# Patient Record
Sex: Male | Born: 1970 | Race: White | Hispanic: No | Marital: Married | Smoking: Current every day smoker
Health system: Southern US, Community
[De-identification: ages and names within clinical notes are randomized; demographics above are authoritative.]

## PROBLEM LIST (undated history)

## (undated) DIAGNOSIS — K219 Gastro-esophageal reflux disease without esophagitis: Secondary | ICD-10-CM

## (undated) DIAGNOSIS — F319 Bipolar disorder, unspecified: Secondary | ICD-10-CM

## (undated) DIAGNOSIS — I10 Essential (primary) hypertension: Secondary | ICD-10-CM

## (undated) DIAGNOSIS — E119 Type 2 diabetes mellitus without complications: Secondary | ICD-10-CM

## (undated) HISTORY — PX: OTHER SURGICAL HISTORY: SHX169

---

## 2004-10-18 ENCOUNTER — Emergency Department (HOSPITAL_COMMUNITY): Admission: EM | Admit: 2004-10-18 | Discharge: 2004-10-18 | Payer: Self-pay | Admitting: Emergency Medicine

## 2006-01-02 ENCOUNTER — Emergency Department (HOSPITAL_COMMUNITY): Admission: EM | Admit: 2006-01-02 | Discharge: 2006-01-02 | Payer: Self-pay | Admitting: Emergency Medicine

## 2006-02-20 ENCOUNTER — Emergency Department (HOSPITAL_COMMUNITY): Admission: EM | Admit: 2006-02-20 | Discharge: 2006-02-20 | Payer: Self-pay | Admitting: Emergency Medicine

## 2006-03-09 ENCOUNTER — Emergency Department (HOSPITAL_COMMUNITY): Admission: EM | Admit: 2006-03-09 | Discharge: 2006-03-09 | Payer: Self-pay | Admitting: Emergency Medicine

## 2015-01-21 ENCOUNTER — Inpatient Hospital Stay (HOSPITAL_COMMUNITY)
Admission: AD | Admit: 2015-01-21 | Discharge: 2015-01-25 | DRG: 539 | Disposition: A | Discharge status: Skilled Nursing Facility | Payer: Medicaid Other | Source: Other Acute Inpatient Hospital | Attending: Internal Medicine | Admitting: Internal Medicine

## 2015-01-21 DIAGNOSIS — M4628 Osteomyelitis of vertebra, sacral and sacrococcygeal region: Principal | ICD-10-CM | POA: Diagnosis present

## 2015-01-21 DIAGNOSIS — F17200 Nicotine dependence, unspecified, uncomplicated: Secondary | ICD-10-CM | POA: Diagnosis present

## 2015-01-21 DIAGNOSIS — I2510 Atherosclerotic heart disease of native coronary artery without angina pectoris: Secondary | ICD-10-CM | POA: Diagnosis present

## 2015-01-21 DIAGNOSIS — L89153 Pressure ulcer of sacral region, stage 3: Secondary | ICD-10-CM

## 2015-01-21 DIAGNOSIS — D649 Anemia, unspecified: Secondary | ICD-10-CM | POA: Diagnosis not present

## 2015-01-21 DIAGNOSIS — I10 Essential (primary) hypertension: Secondary | ICD-10-CM | POA: Diagnosis present

## 2015-01-21 DIAGNOSIS — E43 Unspecified severe protein-calorie malnutrition: Secondary | ICD-10-CM | POA: Diagnosis present

## 2015-01-21 DIAGNOSIS — J449 Chronic obstructive pulmonary disease, unspecified: Secondary | ICD-10-CM | POA: Diagnosis not present

## 2015-01-21 DIAGNOSIS — M5489 Other dorsalgia: Secondary | ICD-10-CM | POA: Diagnosis present

## 2015-01-21 DIAGNOSIS — L89154 Pressure ulcer of sacral region, stage 4: Secondary | ICD-10-CM | POA: Diagnosis present

## 2015-01-21 DIAGNOSIS — F3130 Bipolar disorder, current episode depressed, mild or moderate severity, unspecified: Secondary | ICD-10-CM | POA: Diagnosis not present

## 2015-01-21 DIAGNOSIS — K219 Gastro-esophageal reflux disease without esophagitis: Secondary | ICD-10-CM | POA: Diagnosis not present

## 2015-01-21 DIAGNOSIS — Z6827 Body mass index (BMI) 27.0-27.9, adult: Secondary | ICD-10-CM

## 2015-01-21 DIAGNOSIS — Z7401 Bed confinement status: Secondary | ICD-10-CM

## 2015-01-21 DIAGNOSIS — R531 Weakness: Secondary | ICD-10-CM | POA: Diagnosis not present

## 2015-01-21 DIAGNOSIS — F322 Major depressive disorder, single episode, severe without psychotic features: Secondary | ICD-10-CM | POA: Diagnosis present

## 2015-01-21 DIAGNOSIS — L8994 Pressure ulcer of unspecified site, stage 4: Secondary | ICD-10-CM | POA: Diagnosis present

## 2015-01-21 DIAGNOSIS — M109 Gout, unspecified: Secondary | ICD-10-CM | POA: Diagnosis not present

## 2015-01-21 DIAGNOSIS — M869 Osteomyelitis, unspecified: Secondary | ICD-10-CM

## 2015-01-21 DIAGNOSIS — E119 Type 2 diabetes mellitus without complications: Secondary | ICD-10-CM | POA: Diagnosis present

## 2015-01-21 DIAGNOSIS — F319 Bipolar disorder, unspecified: Secondary | ICD-10-CM | POA: Diagnosis present

## 2015-01-21 DIAGNOSIS — M8628 Subacute osteomyelitis, other site: Secondary | ICD-10-CM | POA: Diagnosis not present

## 2015-01-21 HISTORY — DX: Type 2 diabetes mellitus without complications: E11.9

## 2015-01-21 HISTORY — DX: Bipolar disorder, unspecified: F31.9

## 2015-01-21 HISTORY — DX: Essential (primary) hypertension: I10

## 2015-01-21 HISTORY — DX: Gastro-esophageal reflux disease without esophagitis: K21.9

## 2015-01-21 MED ORDER — ONDANSETRON HCL 4 MG/2ML IJ SOLN: 4.0000 mg | Freq: Four times a day (QID) | INTRAMUSCULAR | Status: DC | PRN

## 2015-01-21 MED ORDER — ACETAMINOPHEN 650 MG RE SUPP: 650.0000 mg | Freq: Four times a day (QID) | RECTAL | Status: DC | PRN

## 2015-01-21 MED ORDER — ACETAMINOPHEN 325 MG PO TABS
650.0000 mg | ORAL_TABLET | Freq: Four times a day (QID) | ORAL | Status: DC | PRN
  Administered 2015-01-22 – 2015-01-24 (×2): 650 mg via ORAL
  Filled 2015-01-21 (×2): qty 2

## 2015-01-21 MED ORDER — OXYCODONE HCL 5 MG PO TABS
5.0000 mg | ORAL_TABLET | ORAL | Status: DC | PRN
  Administered 2015-01-22 – 2015-01-25 (×9): 5 mg via ORAL
  Filled 2015-01-21 (×9): qty 1

## 2015-01-21 MED ORDER — ENOXAPARIN SODIUM 40 MG/0.4ML ~~LOC~~ SOLN
40.0000 mg | SUBCUTANEOUS | Status: DC
  Administered 2015-01-22 – 2015-01-25 (×4): 40 mg via SUBCUTANEOUS
  Filled 2015-01-21 (×4): qty 0.4

## 2015-01-21 MED ORDER — ONDANSETRON HCL 4 MG PO TABS
4.0000 mg | ORAL_TABLET | Freq: Four times a day (QID) | ORAL | Status: DC | PRN
Start: 2015-01-21 — End: 2015-01-25

## 2015-01-21 NOTE — H&P (Signed)
History and Physical  Patient Name: Antonio Jensen     ZOX:[id number]    DOB: 1970/03/30    DOA: 01/21/2015 Ref[facility] PCP: No primary care Adisa Litt on file.      Chief Complaint: Buttock pain  HPI: Antonio Jensen is a 44 y.o. male with a past medical history significant for severe depression, severe PCM, T2DM, HTN, and GERD who presents with sacral osteomyelitis.  The patient is a poor historian, and so most history is collected from outside ED MD.  He developed a decubitus ulcer on the left buttock over the last few months, he states, that has gotten worse and worse.  Per outside Suesan Mohrmann, the patient was admitted for an abscess of the decubitus ulcer about 6 weeks ago. The ulcer was provided and he was treated with antibiotics for almost a month at [facility] and then discharged 2 weeks ago.  He was at home, but returned to the hospital because his wife noticed drainage from the wound and it was more painful. There is no bone visible in the wound, and [facility] were asked to admit because of availability of infectious disease consultation.  In the ED there, the patient was afebrile with mild leukocytosis and hemodynamically stable.  To me he endorses sitting in his recliner at home and not being able to do much of anything else. He endorses pain. He is not forthcoming with other details and mostly denies questions (fever, chills, pain elsewhere) or says "I don't know".     Review of Systems:  Pt complains of pain in sacrum. Pt denies any fever chills.  Unable to obtain other ROS due to patient degree of psychomotor slowing and disaffect.   Allergies:  Allergies  Allergen Reactions  . Sulfa Antibiotics     Home medications: 1. Risperidone 2 mg nightly 2. Trazodone 5-100 mg daily at bedtime when necessary for sleep 3. Furosemide 20 mg daily 4. Lisinopril-HCTZ 10-12 0.5 mg daily 5. Allopurinol 300 mg daily 6. Omeprazole 40 mg twice  daily 7. Oxycodone 5 mg every 6 hours when necessary for pain   Past medical history: 1. HTN 2. Gout 3. GERD 4. Severe depression or bipolar disorder 5. COPD 6. History of CAD from outside records  Family history:  Hypertension, heart disease, COPD, per outside records.   Social History:  Patient lives with his wife and stepson. He does not work currently. He does smoke sometimes.        Physical Exam: BP 121/70 mmHg  Pulse 88  Temp(Src) 97.7 F (36.5 C) (Oral)  Resp 16  Ht 5\' 2"  (1.575 m)  Wt 68.266 kg (150 lb 8 oz)  BMI 27.52 kg/m2  SpO2 98% General appearance: Emaciated adult male, sleeping and difficult to arouse. Sluggish and marked psychomotor slowing.   Eyes: Left eyelid swelling. Her discharge.    ENT: No nasal deformity, discharge, or epistaxis.  OP moist without lesions.  poor dentition. Halitosis. Skin: Pallor. On the left buttock there is a 3 x 5 cm deep sacral ulcer packed with gauze with surrounding hyperpigmentation without induration. This was not unpacked on my exam. Cardiac: RRR, nl S1-S2, no murmurs appreciated.  3+ LE edema.   Respiratory: Poor inspiratory effort. Abdomen: Abdomen soft , distended, no tenderness to palpation. MSK: Muscle wasting noted on arms, legs, thenar eminence Neuro: Oriented to place, and time and person. Marked psychomotor slowing. Globally very weak. Does not follow commands well.  Psych: Affect catatonic.  No evidence  of aural or visual hallucinations or delusions.       Labs on Admission, transmitted from outside hospital:  The metabolic panel shows normal sodium, potassium, bicarbonate, and renal function. Transaminases and bilirubin are normal. The complete blood count shows mild leukocytosis, anemia to 9.6 g/dL, and normal platelet count    Radiological Exams on Admission: The report of a CT scan of the abdomen and pelvis from the outside hospital notes osteomyelitis of the  sacrum.        Assessment/Plan 1. Sacral osteomyelitis:  The patient has chronic decubitus ulcer which now has resulted in osteomyelitis.  He reportedly took almost a month of antibiotics for this ulcer within the last 6 weeks at [facility] and had a wound-vac on the ulcer at one point in time. -Obtain outside records from last hospitalization from [facility] -Vancomycin and piperacillin tazobactam -Consult infectious disease -Consult the wound care and plastic surgery if necessary      2. Anemia:  Unclear chronicity.  Normocytic.  3. Severe protein calorie malnutrition:  -Nutrition consult  4. GERD:  -Continue PPI  5. Gout:  Stable. No active disease. -Hold allopurinol  6. HTN:  Normotensive at admission. -Hold lisinopril HCTZ and furosemide for now  7. Depression: -Consult to psychiatry -Continue risperidone    DVT PPx: Lovenox Diet: Regular Consultants: ID, Wound Care, Plastic Surgery, Psychiatry, Nutrition Code Status: Full   Patient seen 10:41 PM on 01/21/2015.  Disposition Plan:  Admit for IV antibiotics for osteomyelitis.  Consult to ID, wound, surgery, and Psych.      Alberteen Sam [facility] Pager [phone]

## 2015-01-22 ENCOUNTER — Encounter (HOSPITAL_COMMUNITY): Payer: Self-pay | Admitting: Family Medicine

## 2015-01-22 ENCOUNTER — Inpatient Hospital Stay (HOSPITAL_COMMUNITY): Payer: Medicaid Other

## 2015-01-22 DIAGNOSIS — F3130 Bipolar disorder, current episode depressed, mild or moderate severity, unspecified: Secondary | ICD-10-CM

## 2015-01-22 DIAGNOSIS — I10 Essential (primary) hypertension: Secondary | ICD-10-CM

## 2015-01-22 DIAGNOSIS — L8994 Pressure ulcer of unspecified site, stage 4: Secondary | ICD-10-CM

## 2015-01-22 LAB — CBC
HEMATOCRIT: 27.1 % — AB (ref 39.0–52.0)
HEMOGLOBIN: 8.7 g/dL — AB (ref 13.0–17.0)
MCH: 26.7 pg (ref 26.0–34.0)
MCHC: 32.1 g/dL (ref 30.0–36.0)
MCV: 83.1 fL (ref 78.0–100.0)
Platelets: 128 10*3/uL — ABNORMAL LOW (ref 150–400)
RBC: 3.26 MIL/uL — ABNORMAL LOW (ref 4.22–5.81)
RDW: 14.1 % (ref 11.5–15.5)
WBC: 8.1 10*3/uL (ref 4.0–10.5)

## 2015-01-22 LAB — COMPREHENSIVE METABOLIC PANEL
ALBUMIN: 1.5 g/dL — AB (ref 3.5–5.0)
ALK PHOS: 81 U/L (ref 38–126)
ALT: 9 U/L — AB (ref 17–63)
AST: 10 U/L — AB (ref 15–41)
Anion gap: 4 — ABNORMAL LOW (ref 5–15)
BILIRUBIN TOTAL: 0.2 mg/dL — AB (ref 0.3–1.2)
BUN: 25 mg/dL — AB (ref 6–20)
CALCIUM: 8.1 mg/dL — AB (ref 8.9–10.3)
CO2: 27 mmol/L (ref 22–32)
CREATININE: 0.58 mg/dL — AB (ref 0.61–1.24)
Chloride: 107 mmol/L (ref 101–111)
GFR calc Af Amer: >60 mL/min (ref >60)
GLUCOSE: 157 mg/dL — AB (ref 65–99)
Potassium: 3.7 mmol/L (ref 3.5–5.1)
Sodium: 138 mmol/L (ref 135–145)
TOTAL PROTEIN: 4.6 g/dL — AB (ref 6.5–8.1)

## 2015-01-22 LAB — CREATININE, SERUM
Creatinine, Ser: 0.67 mg/dL (ref 0.61–1.24)
GFR calc Af Amer: >60 mL/min (ref >60)

## 2015-01-22 MED ORDER — ALLOPURINOL 300 MG PO TABS
300.0000 mg | ORAL_TABLET | Freq: Every day | ORAL | Status: DC
  Administered 2015-01-22 – 2015-01-25 (×4): 300 mg via ORAL
  Filled 2015-01-22 (×4): qty 1

## 2015-01-22 MED ORDER — DULOXETINE HCL 30 MG PO CPEP
30.0000 mg | ORAL_CAPSULE | Freq: Every day | ORAL | Status: DC
  Administered 2015-01-22 – 2015-01-25 (×4): 30 mg via ORAL
  Filled 2015-01-22 (×4): qty 1

## 2015-01-22 MED ORDER — RISPERIDONE 0.5 MG PO TABS
2.0000 mg | ORAL_TABLET | Freq: Every day | ORAL | Status: DC
  Administered 2015-01-22 – 2015-01-24 (×3): 2 mg via ORAL
  Filled 2015-01-22 (×3): qty 4

## 2015-01-22 MED ORDER — GADOBENATE DIMEGLUMINE 529 MG/ML IV SOLN
10.0000 mL | Freq: Once | INTRAVENOUS | Status: AC | PRN
  Administered 2015-01-22: 10 mL via INTRAVENOUS

## 2015-01-22 MED ORDER — PIPERACILLIN-TAZOBACTAM 3.375 G IVPB
3.3750 g | Freq: Three times a day (TID) | INTRAVENOUS | Status: DC
  Administered 2015-01-22 (×2): 3.375 g via INTRAVENOUS
  Filled 2015-01-22 (×4): qty 50

## 2015-01-22 MED ORDER — VANCOMYCIN HCL IN DEXTROSE 750-5 MG/150ML-% IV SOLN
750.0000 mg | Freq: Three times a day (TID) | INTRAVENOUS | Status: DC
  Administered 2015-01-22: 750 mg via INTRAVENOUS
  Filled 2015-01-22 (×3): qty 150

## 2015-01-22 MED ORDER — TRAZODONE HCL 50 MG PO TABS: 50.0000 mg | ORAL_TABLET | Freq: Every evening | ORAL | Status: DC | PRN

## 2015-01-22 MED ORDER — LORAZEPAM 2 MG/ML IJ SOLN
1.0000 mg | Freq: Once | INTRAMUSCULAR | Status: AC
  Administered 2015-01-22: 1 mg via INTRAVENOUS
  Filled 2015-01-22: qty 1

## 2015-01-22 MED ORDER — PANTOPRAZOLE SODIUM 40 MG PO TBEC
40.0000 mg | DELAYED_RELEASE_TABLET | Freq: Every day | ORAL | Status: DC
  Administered 2015-01-22 – 2015-01-25 (×4): 40 mg via ORAL
  Filled 2015-01-22 (×4): qty 1

## 2015-01-22 NOTE — Progress Notes (Signed)
Patient Demographics:    Antonio Jensen, is a 44 y.o. male, DOB - January 01, 1971, ZOX:[id number]  Admit dateCorbin Jensen - 01/21/2015   Admitting Physician [name], MD  Outpatient Primary MD for the patient is No primary care provider on file.  LOS - 1   No chief complaint on file.       Subjective:    Antonio Jensen today has, No headache, No chest pain, No abdominal pain - No Nausea, No new weakness tingling or numbness, No Cough - SOB. Continues to have dull low back pain.   Assessment  & Plan :     1. 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 Recently treated with one month of antibiotics [facility], off antibiotics for 2 weeks. Readmitted for possible osteomyelitis. Discussed his case with ID physician Dr. Ilsa IhaSnyder, hold off antibiotics, currently not septic, get MRI of the sacral area. Continue wound care, may require bone biopsy off antibiotics.   2. Denies weakness and deconditioning. Discussed with wife in detail, patient until June of this year was driving and walking, thereafter became progressively weak due to lack of activity which came about after he developed a sacral ulcer due to itching. Initiate PT and placement.   3. Bipolar disorder with depression. Currently on trazodone, risperidone which will be continued.   4. GERD. On PPI continue.   Code Status : Full  Family Communication  : Wife over the phone  Disposition Plan  : To be decided  Consults  :  ID physician Dr. Ilsa IhaSnyder over the phone  Procedures  : MRI sacrum  DVT Prophylaxis  :  Lovenox   Lab Results  Component Value Date   PLT 128* 01/22/2015    Inpatient  Medications  Scheduled Meds: . enoxaparin (LOVENOX) injection  40 mg Subcutaneous Q24H  . pantoprazole  40 mg Oral Daily  . risperiDONE  2 mg Oral QHS   Continuous Infusions:  PRN Meds:.acetaminophen **OR** acetaminophen, ondansetron **OR** ondansetron (ZOFRAN) IV, oxyCODONE, traZODone  Antibiotics  :     Anti-infectives    Start     Dose/Rate Route Frequency Ordered Stop   01/22/15 0100  vancomycin (VANCOCIN) IVPB 750 mg/150 ml premix  Status:  Discontinued     750 mg 150 mL/hr over 60 Minutes Intravenous Every 8 hours 01/22/15 0008 01/22/15 0931   01/22/15 0015  piperacillin-tazobactam (ZOSYN) IVPB 3.375 g  Status:  Discontinued     3.375 g 12.5 mL/hr over 240 Minutes Intravenous 3 times per day 01/22/15 0008 01/22/15 0931        Objective:   Filed Vitals:   01/21/15 2240 01/22/15 0516  BP: 109/58 121/70  Pulse: 88 88  Temp: 98.7 F (37.1 C) 97.7 F (36.5 C)  TempSrc: Oral Oral  Resp: 16 16  Height: 5\' 2"  (1.575 m)   Weight: 68.266 kg (150 lb 8 oz)   SpO2: 98% 98%    Wt Readings from Last 3 Encounters:  01/21/15 68.266 kg (150 lb 8 oz)     Intake/Output Summary (Last 24 hours) at 01/22/15 1242 Last data filed at 01/22/15 0500  Gross per 24 hour  Intake      0 ml  Output    400 ml  Net   -400 ml     Physical Exam  Awake Alert, Oriented X 3, No new F.N deficits, Normal affect [place].AT,PERRAL Supple Neck,No JVD, No cervical lymphadenopathy appriciated.  Symmetrical Chest wall movement, Good air movement bilaterally, CTAB RRR,No Gallops,Rubs or new Murmurs, No Parasternal Heave +ve B.Sounds, Abd Soft, No tenderness, No organomegaly appriciated, No rebound - guarding or rigidity. No Cyanosis, Clubbing or edema, No new Rash or bruise  Stage IV sacral decubitus ulcer kindly see wound care note for all details    Data Review:   Micro Results No results found for this or any previous visit (from the past 240 hour(s)).  Radiology Reports No results found.    CBC  Recent Labs Lab 01/22/15 0002  WBC 8.1  HGB 8.7*  HCT 27.1*  PLT 128*  MCV 83.1  MCH 26.7  MCHC 32.1  RDW 14.1    Chemistries   Recent Labs Lab 01/22/15 0002 01/22/15 0350  NA  --  138  K  --  3.7  CL  --  107  CO2  --  27  GLUCOSE  --  157*  BUN  --  25*  CREATININE 0.67 0.58*  CALCIUM  --  8.1*  AST  --  10*  ALT  --  9*  ALKPHOS  --  81  BILITOT  --  0.2*   ------------------------------------------------------------------------------------------------------------------ estimated creatinine clearance is 98.1 mL/min (by C-G formula based on Cr of 0.58). ------------------------------------------------------------------------------------------------------------------ No results for input(s): HGBA1C in the last 72 hours. ------------------------------------------------------------------------------------------------------------------ No results for input(s): CHOL, HDL, LDLCALC, TRIG, CHOLHDL, LDLDIRECT in the last 72 hours. ------------------------------------------------------------------------------------------------------------------ No results for input(s): TSH, T4TOTAL, T3FREE, THYROIDAB in the last 72 hours.  Invalid input(s): FREET3 ------------------------------------------------------------------------------------------------------------------ No results for input(s): VITAMINB12, FOLATE, FERRITIN, TIBC, IRON, RETICCTPCT in the last 72 hours.  Coagulation profile No results for input(s): INR, PROTIME in the last 168 hours.  No results for input(s): DDIMER in the last 72 hours.  Cardiac Enzymes No results for input(s): CKMB, TROPONINI, MYOGLOBIN in the last 168 hours.  Invalid input(s): CK ------------------------------------------------------------------------------------------------------------------ Invalid input(s): POCBNP   Time Spent in minutes  35   SINGH,PRASHANT K M.D on 01/22/2015 at 12:42 PM  Between 7am to 7pm - Pager -  [phone]  After 7pm go to [email or web address] - password [facility]  [facility] -  Office  [phone]

## 2015-01-22 NOTE — Progress Notes (Signed)
ANTIBIOTIC CONSULT NOTE - INITIAL  Pharmacy Consult for Vancomycin/Zosyn Indication: Wound infection, sacral osteomyelitis  Allergies  Allergen Reactions  . Sulfa Antibiotics    Patient Measurements: Height: 5\' 2"  (157.5 cm) Weight: 150 lb 8 oz (68.266 kg) IBW/kg (Calculated) : 54.6  Vital Signs: Temp: 98.7 F (37.1 C) (11/26 2240) Temp Source: Oral (11/26 2240) BP: 109/58 mmHg (11/26 2240) Pulse Rate: 88 (11/26 2240)   [facility]abs [facility] per MD note: Scr 0.7 WBC 11.1  Assessment: 44 y/o M [facility] with sacral osteomyelitis/wound infection, Scr 0.7 but renal function may be harder to estimate given that pt is mostly bed bound and has a low muscle mass, received a dose of Vancomycin at [facility] around 1600 on 11/26.   Goal of Therapy:  Vancomycin trough level 15-20 mcg/ml  Plan:  -Vancomycin 750 mg IV q8h -Zosyn 3.375G IV q8h to be infused over 4 hours -Trend WBC, temp, renal function  -Drug levels as indicated   Abran DukeLedford, Katelyne Galster 01/22/2015,12:02 AM

## 2015-01-22 NOTE — Progress Notes (Signed)
Utilization review completed.  

## 2015-01-22 NOTE — Consult Note (Signed)
WOC wound consult note Reason for Consult: Chronic, non-healing Stage 4 pressure injury to scarum, DTPI to right lateral heel and blanchable erythema to left lateral heel.  Patient found in bed lying in the supine position. It is noted that Plastics has been consulted.  I will provide Nursing with orders until that assessment and POC is established. Wound type:pressure Pressure Ulcer POA: Yes Measurement: 5.2cm x 8.4cm x 2cm with undermining at 3 o'clock measuring 2cm and at 11 o'clock measuring 3cm Wound bed: red, moist with <25% necrotic tissue. The left lateral heel presents with blanchable erythema in an area measuring 2cm x 3cm and the right lateral heel presents with an area of deep tissue pressure injury, i.e. Purple discoloration measuring 1 cm x 1.6cm. Drainage (amount, consistency, odor) small amount of serous exudate on old dressing, but bedside RN reports that it was changed less than 1 hour ago.  Patient is incontinence of soft brown stool and the previously changed dressing is soiled and contaminated. Periwound: Circumferential erythema and evidence of medical adhesive related skin injury (MARSI). Dressing procedure/placement/frequency: Orders are provided today for light filling of the sacral defect with saline moistened roll gauze (Kerlix), protection of the periwound skin with our house moisture barrier ointment (Critic Aid Clear) and covering both areas with a soft absorbant dressing (ABD).  The adhesive recommended is paper tape and the change frequency is twice daily and PRN soiling.  A therapeutic mattress (constant low pressure) with low air loss feature is provided as are bilateral pressure redistribution heel boots as patient does not like to turn and reposition off of the supine position. Nevertheless, Nursing is encouraged to turn and reposition despite the therapeutic mattress. WOC nursing team will not follow, as Plastics has been consulted and their orders will supercede those  of WOC Nursing.   Please re-consult if needed. Thanks, Ladona MowLaurie Kinza Gouveia, MSN, RN, GNP, Hans EdenCWOCN, CWON-AP, FAAN  Pager# [phone]

## 2015-01-22 NOTE — Consult Note (Signed)
[facility] Psychiat[facility] Consult   Reason for Consult:  Bipolar disorder, depression Referring Physician:  Dr. Candiss Norse Patient Identification: PAU BANH MRN:  [id number] Principal Diagnosis: Osteomyelitis [facility]) Diagnosis:   Patient Active Problem List   Diagnosis Date Noted  . Pressure ulcer stage IV ([place]) [L89.94] 01/21/2015  . Osteomyelitis ([place]) [M86.9] 01/21/2015  . Essential hypertension [I10] 01/21/2015  . Bipolar depression ([place]) [F31.30] 01/21/2015    Total Time spent with patient: 20 minutes  Subjective:   Antonio Jensen is a 44 y.o. male patient admitted with sacral osteomyelitis.  HPI:  This patient is a 44 year old married white male who lives with his wife and son in [place]. He is unemployed and applying for disability. He was admitted from an outside hospital with clinical evidence of sacral osteomyelitis and stage IV sacral decubitus ulcer. He is in a wheelchair bedbound status due to generalized weakness and deconditioning. He was recently treated with the month of antibiotics at [facility]. Currently he is off antibiotics and is not septic and an MRI of the sacral area has been obtained.  Apparently the patient has a history of bipolar disorder but he said very little and only answered questions with yes or no and went right back to sleep. I attempted to call his wife at both numbers with no answer. According to the notes he was driving and walking until June of this year and became progressively weak due to lack of activity after he developed a sacral ulcer due to itching. He is a poor historian barely opens his eyes and will not answer most questions other than a yes or no. He is states that he is simply tired of being in and out of hospitals and  His wound not healing. He denies past psychiatric inpatient hospitalizations or psychiatric outpatient treatment. He claims his primary care physician handles all of his medicine. He does not know why he is on  Risperdal. He does not remember being on other medications for depression. He denies being suicidal or having auditory visualizations. He denies use of drugs or alcohol  Past Psychiatric History: Chart reveals history of bipolar disorder but no other information is forthcoming from the patient  Risk to Self: Is patient at risk for suicide?: No Risk to Others:   Prior Inpatient Therapy:   Prior Outpatient Therapy:    Past Medical History:  Past Medical History  Diagnosis Date  . Hypertension   . Bipolar disorder ([place])   . GERD (gastroesophageal reflux disease)   . Diabetes mellitus without complication [facility])     Past Surgical History  Procedure Laterality Date  . Boil i&d  unknown    hx provided by wife   . Testical i&d/boil  unknown    hx provided by wife   . Cardiac cathertization no stent placed  unknown     hx provided by wife    Family History:  Family History  Problem Relation Age of Onset  . Hypertension Other   . Coronary artery disease Other   . COPD Other    Family Psychiatric  History:none Social History:  History  Alcohol Use No     History  Drug Use No    Social History   Social History  . Marital Status: Married    Spouse Name: N/A  . Number of Children: N/A  . Years of Education: N/A   Social History Main Topics  . Smoking status: Current Every Day Smoker  . Smokeless tobacco: None  .  Alcohol Use: No  . Drug Use: No  . Sexual Activity: Not Asked   Other Topics Concern  . None   Social History Narrative  . None   Additional Social History:                          Allergies:   Allergies  Allergen Reactions  . Sulfa Antibiotics     Labs:  Results for orders placed or performed during the hospital encounter of 01/21/15 (from the past 48 hour(s))  CBC     Status: Abnormal   Collection Time: 01/22/15 12:02 AM  Result Value Ref Range   WBC 8.1 4.0 - 10.5 K/uL    Comment: WHITE COUNT CONFIRMED ON SMEAR   RBC 3.26 (L) 4.22  - 5.81 MIL/uL   Hemoglobin 8.7 (L) 13.0 - 17.0 g/dL   HCT 27.1 (L) 39.0 - 52.0 %   MCV 83.1 78.0 - 100.0 fL   MCH 26.7 26.0 - 34.0 pg   MCHC 32.1 30.0 - 36.0 g/dL   RDW 14.1 11.5 - 15.5 %   Platelets 128 (L) 150 - 400 K/uL    Comment: PLATELET COUNT CONFIRMED BY SMEAR  Creatinine, serum     Status: None   Collection Time: 01/22/15 12:02 AM  Result Value Ref Range   Creatinine, Ser 0.67 0.61 - 1.24 mg/dL   GFR calc non Af Amer >60 >60 mL/min   GFR calc Af Amer >60 >60 mL/min    Comment: (NOTE) The eGFR has been calculated using the CKD EPI equation. This calculation has not been validated in all clinical situations. eGFR's persistently <60 mL/min signify possible Chronic Kidney Disease.   Comprehensive metabolic panel     Status: Abnormal   Collection Time: 01/22/15  3:50 AM  Result Value Ref Range   Sodium 138 135 - 145 mmol/L   Potassium 3.7 3.5 - 5.1 mmol/L   Chloride 107 101 - 111 mmol/L   CO2 27 22 - 32 mmol/L   Glucose, Bld 157 (H) 65 - 99 mg/dL   BUN 25 (H) 6 - 20 mg/dL   Creatinine, Ser 0.58 (L) 0.61 - 1.24 mg/dL   Calcium 8.1 (L) 8.9 - 10.3 mg/dL   Total Protein 4.6 (L) 6.5 - 8.1 g/dL   Albumin 1.5 (L) 3.5 - 5.0 g/dL   AST 10 (L) 15 - 41 U/L   ALT 9 (L) 17 - 63 U/L   Alkaline Phosphatase 81 38 - 126 U/L   Total Bilirubin 0.2 (L) 0.3 - 1.2 mg/dL   GFR calc non Af Amer >60 >60 mL/min   GFR calc Af Amer >60 >60 mL/min    Comment: (NOTE) The eGFR has been calculated using the CKD EPI equation. This calculation has not been validated in all clinical situations. eGFR's persistently <60 mL/min signify possible Chronic Kidney Disease.    Anion gap 4 (L) 5 - 15    Current Facility-Administered Medications  Medication Dose Route Frequency Provider Last Rate Last Dose  . acetaminophen (TYLENOL) tablet 650 mg  650 mg Oral Q6H PRN Edwin Dada, MD       Or  . acetaminophen (TYLENOL) suppository 650 mg  650 mg Rectal Q6H PRN Edwin Dada, MD      .  allopurinol (ZYLOPRIM) tablet 300 mg  300 mg Oral Daily Thurnell Lose, MD      . enoxaparin (LOVENOX) injection 40 mg  40 mg Subcutaneous Q24H Harrell Gave  Shirleen Schirmer, MD   40 mg at 01/22/15 1042  . ondansetron (ZOFRAN) tablet 4 mg  4 mg Oral Q6H PRN Edwin Dada, MD       Or  . ondansetron (ZOFRAN) injection 4 mg  4 mg Intravenous Q6H PRN Edwin Dada, MD      . oxyCODONE (Oxy IR/ROXICODONE) immediate release tablet 5 mg  5 mg Oral Q4H PRN Edwin Dada, MD   5 mg at 01/22/15 0807  . pantoprazole (PROTONIX) EC tablet 40 mg  40 mg Oral Daily Edwin Dada, MD   40 mg at 01/22/15 1042  . risperiDONE (RISPERDAL) tablet 2 mg  2 mg Oral QHS Edwin Dada, MD      . traZODone (DESYREL) tablet 50-100 mg  50-100 mg Oral QHS PRN Edwin Dada, MD        Musculoskeletal: Strength & Muscle Tone: decreased Gait & Station: unable to stand Patient leans: N/A  Psychiatric Specialty Exam: Review of Systems  Constitutional: Positive for malaise/fatigue.  Musculoskeletal: Positive for back pain.  Psychiatric/Behavioral: Positive for depression.    Blood pressure 121/70, pulse 88, temperature 97.7 F (36.5 C), temperature source Oral, resp. rate 16, height 5' 2"  (1.575 m), weight 68.266 kg (150 lb 8 oz), SpO2 98 %.Body mass index is 27.52 kg/(m^2).  General Appearance: Disheveled  Eye Sport and exercise psychologist::  None  Speech:  Slow  Volume:  Decreased  Mood:  Depressed and Dysphoric unable to open eyes or stay awake   Affect:  Constricted and Depressed  Thought Process:  Difficult to assess as he says so little  Orientation:  Full (Time, Place, and Person)  Thought Content:  Rumination  Suicidal Thoughts:  No  Homicidal Thoughts:  No  Memory:  Immediate;   Poor Recent;   Poor Remote;   Poor  Judgement:  Impaired  Insight:  Lacking  Psychomotor Activity:  Decreased  Concentration:  Poor  Recall:  Poor  Fund of Knowledge:Poor  Language: Fair  Akathisia:  No   Handed:  Right  AIMS (if indicated):     Assets:  Social Support  ADL's:  Impaired  Cognition: WNL  Sleep:      Treatment Plan Summary: Daily contact with patient to assess and evaluate symptoms and progress in treatment and Medication management  Disposition: Patient does not meet criteria for psychiatric inpatient admission.  Patient was very difficult to interview as he would not wake up and answer questions. He states that he is tired and I so he severely depressed. He is currently on no medication directly to treat depression. Since he is in pain we will start with Cymbalta 30 mg daily and continue to follow.  ROSS, [facility] 01/22/2015 3:11 PM

## 2015-01-23 DIAGNOSIS — M869 Osteomyelitis, unspecified: Secondary | ICD-10-CM

## 2015-01-23 LAB — CBC
HEMATOCRIT: 25.6 % — AB (ref 39.0–52.0)
HEMOGLOBIN: 8.2 g/dL — AB (ref 13.0–17.0)
MCH: 27 pg (ref 26.0–34.0)
MCHC: 32 g/dL (ref 30.0–36.0)
MCV: 84.2 fL (ref 78.0–100.0)
Platelets: 265 10*3/uL (ref 150–400)
RBC: 3.04 MIL/uL — AB (ref 4.22–5.81)
RDW: 14.1 % (ref 11.5–15.5)
WBC: 8.6 10*3/uL (ref 4.0–10.5)

## 2015-01-23 LAB — COMPREHENSIVE METABOLIC PANEL
ALBUMIN: 1.5 g/dL — AB (ref 3.5–5.0)
ALT: 9 U/L — AB (ref 17–63)
ANION GAP: 7 (ref 5–15)
AST: 11 U/L — AB (ref 15–41)
Alkaline Phosphatase: 79 U/L (ref 38–126)
BILIRUBIN TOTAL: 0.5 mg/dL (ref 0.3–1.2)
BUN: 19 mg/dL (ref 6–20)
CALCIUM: 7.9 mg/dL — AB (ref 8.9–10.3)
CO2: 26 mmol/L (ref 22–32)
CREATININE: 0.74 mg/dL (ref 0.61–1.24)
Chloride: 105 mmol/L (ref 101–111)
GFR calc Af Amer: >60 mL/min (ref >60)
GFR calc non Af Amer: >60 mL/min (ref >60)
GLUCOSE: 282 mg/dL — AB (ref 65–99)
Potassium: 3.6 mmol/L (ref 3.5–5.1)
SODIUM: 138 mmol/L (ref 135–145)
TOTAL PROTEIN: 4.5 g/dL — AB (ref 6.5–8.1)

## 2015-01-23 LAB — HEMOGLOBIN A1C
HEMOGLOBIN A1C: 6.4 % — AB (ref 4.8–5.6)
MEAN PLASMA GLUCOSE: 137 mg/dL

## 2015-01-23 MED ORDER — ADULT MULTIVITAMIN W/MINERALS CH
1.0000 | ORAL_TABLET | Freq: Every day | ORAL | Status: DC
Start: 2015-01-23 — End: 2015-01-25
  Administered 2015-01-23 – 2015-01-25 (×3): 1 via ORAL
  Filled 2015-01-23 (×3): qty 1

## 2015-01-23 MED ORDER — DEXTROSE 5 % IV SOLN
2.0000 g | INTRAVENOUS | Status: DC
  Administered 2015-01-23 – 2015-01-25 (×3): 2 g via INTRAVENOUS
  Filled 2015-01-23 (×3): qty 2

## 2015-01-23 MED ORDER — VANCOMYCIN HCL IN DEXTROSE 750-5 MG/150ML-% IV SOLN
750.0000 mg | Freq: Three times a day (TID) | INTRAVENOUS | Status: DC
  Administered 2015-01-23 – 2015-01-24 (×5): 750 mg via INTRAVENOUS
  Filled 2015-01-23 (×7): qty 150

## 2015-01-23 MED ORDER — SODIUM CHLORIDE 0.9 % IJ SOLN
10.0000 mL | INTRAMUSCULAR | Status: DC | PRN
  Administered 2015-01-25: 10 mL
  Filled 2015-01-23: qty 40

## 2015-01-23 MED ORDER — PRO-STAT SUGAR FREE PO LIQD
30.0000 mL | Freq: Two times a day (BID) | ORAL | Status: DC
  Administered 2015-01-23: 1 mL via ORAL
  Administered 2015-01-24: 30 mL via ORAL
  Filled 2015-01-23 (×2): qty 30

## 2015-01-23 NOTE — Consult Note (Addendum)
[place] [facility]iatry Consult   Reason for Consult:  Bipolar disorder, depression Referring Physician:  Dr. Candiss Norse Patient Identification: Antonio Jensen MRN:  [id number] Principal Diagnosis: Bipolar depression [facility]) Diagnosis:   Patient Active Problem List   Diagnosis Date Noted  . Pressure ulcer stage IV ([place]) [L89.94] 01/21/2015  . Osteomyelitis ([place]) [M86.9] 01/21/2015  . Essential hypertension [I10] 01/21/2015  . Bipolar depression ([place]) [F31.30] 01/21/2015    Total Time spent with patient: 20 minutes  Subjective:   Antonio Jensen is a 44 y.o. male patient admitted with sacral osteomyelitis.  HPI:  This patient is a 44 year old married white male who lives with his wife and son in [place]. He is unemployed and applying for disability. He was admitted from an outside hospital with clinical evidence of sacral osteomyelitis and stage IV sacral decubitus ulcer. He is in a wheelchair/bed bound status due to generalized weakness and deconditioning. He was recently treated with the month of antibiotics at [facility]. Currently he is off antibiotics and is not septic and an MRI of the sacral area has been obtained.  Apparently the patient has a history of bipolar disorder but he said very little and only answered questions with yes or no and went right back to sleep. I attempted to call his wife at both numbers with no answer. According to the notes he was driving and walking until June of this year and became progressively weak due to lack of activity after he developed a sacral ulcer due to itching. He is a poor historian barely opens his eyes and will not answer most questions other than a yes or no. He is states that he is simply tired of being in and out of hospitals and  His wound not healing. He denies past psychiatric inpatient hospitalizations or psychiatric outpatient treatment. He claims his primary care physician handles all of his medicine. He does not know why he  is on Risperdal. He does not remember being on other medications for depression. He denies being suicidal or having auditory visualizations. He denies use of drugs or alcohol  Past Psychiatric History: Chart reveals history of bipolar disorder but no other information is forthcoming from the patient  Interval history: Patient seen today for psychiatric consultation follow-up as requested by Dr. Harrington Challenger. Reviewed initial psychiatric evaluation completed by Dr. Harrington Challenger who recommended Cymbalta 30 mg for depression. Patient is awake, alert, oriented and staying in his bed with head end was elevated during my evaluation. Patient does not have new complaints today and reportedly compliant with his medication and had no side effects. Patient reported he has good pain control with the current treatment regimen. Patient denies any suicidal/homicidal ideation and has no evidence of psychosis.  Risk to Self: Is patient at risk for suicide?: No Risk to Others:   Prior Inpatient Therapy:   Prior Outpatient Therapy:    Past Medical History:  Past Medical History  Diagnosis Date  . Hypertension   . Bipolar disorder ([place])   . GERD (gastroesophageal reflux disease)   . Diabetes mellitus without complication [facility])     Past Surgical History  Procedure Laterality Date  . Boil i&d  unknown    hx provided by wife   . Testical i&d/boil  unknown    hx provided by wife   . Cardiac cathertization no stent placed  unknown     hx provided by wife    Family History:  Family History  Problem Relation Age of Onset  .  Hypertension Other   . Coronary artery disease Other   . COPD Other    Family Psychiatric  History:none Social History:  History  Alcohol Use No     History  Drug Use No    Social History   Social History  . Marital Status: Married    Spouse Name: N/A  . Number of Children: N/A  . Years of Education: N/A   Social History Main Topics  . Smoking status: Current Every Day Smoker  .  Smokeless tobacco: None  . Alcohol Use: No  . Drug Use: No  . Sexual Activity: Not Asked   Other Topics Concern  . None   Social History Narrative  . None   Additional Social History:                          Allergies:   Allergies  Allergen Reactions  . Sulfa Antibiotics     Labs:  Results for orders placed or performed during the hospital encounter of 01/21/15 (from the past 48 hour(s))  CBC     Status: Abnormal   Collection Time: 01/22/15 12:02 AM  Result Value Ref Range   WBC 8.1 4.0 - 10.5 K/uL    Comment: WHITE COUNT CONFIRMED ON SMEAR   RBC 3.26 (L) 4.22 - 5.81 MIL/uL   Hemoglobin 8.7 (L) 13.0 - 17.0 g/dL   HCT 27.1 (L) 39.0 - 52.0 %   MCV 83.1 78.0 - 100.0 fL   MCH 26.7 26.0 - 34.0 pg   MCHC 32.1 30.0 - 36.0 g/dL   RDW 14.1 11.5 - 15.5 %   Platelets 128 (L) 150 - 400 K/uL    Comment: PLATELET COUNT CONFIRMED BY SMEAR  Creatinine, serum     Status: None   Collection Time: 01/22/15 12:02 AM  Result Value Ref Range   Creatinine, Ser 0.67 0.61 - 1.24 mg/dL   GFR calc non Af Amer >60 >60 mL/min   GFR calc Af Amer >60 >60 mL/min    Comment: (NOTE) The eGFR has been calculated using the CKD EPI equation. This calculation has not been validated in all clinical situations. eGFR's persistently <60 mL/min signify possible Chronic Kidney Disease.   Comprehensive metabolic panel     Status: Abnormal   Collection Time: 01/22/15  3:50 AM  Result Value Ref Range   Sodium 138 135 - 145 mmol/L   Potassium 3.7 3.5 - 5.1 mmol/L   Chloride 107 101 - 111 mmol/L   CO2 27 22 - 32 mmol/L   Glucose, Bld 157 (H) 65 - 99 mg/dL   BUN 25 (H) 6 - 20 mg/dL   Creatinine, Ser 0.58 (L) 0.61 - 1.24 mg/dL   Calcium 8.1 (L) 8.9 - 10.3 mg/dL   Total Protein 4.6 (L) 6.5 - 8.1 g/dL   Albumin 1.5 (L) 3.5 - 5.0 g/dL   AST 10 (L) 15 - 41 U/L   ALT 9 (L) 17 - 63 U/L   Alkaline Phosphatase 81 38 - 126 U/L   Total Bilirubin 0.2 (L) 0.3 - 1.2 mg/dL   GFR calc non Af Amer >60 >60  mL/min   GFR calc Af Amer >60 >60 mL/min    Comment: (NOTE) The eGFR has been calculated using the CKD EPI equation. This calculation has not been validated in all clinical situations. eGFR's persistently <60 mL/min signify possible Chronic Kidney Disease.    Anion gap 4 (L) 5 - 15  Hemoglobin A1c  Status: Abnormal   Collection Time: 01/22/15  3:50 AM  Result Value Ref Range   Hgb A1c MFr Bld 6.4 (H) 4.8 - 5.6 %    Comment: (NOTE)         Pre-diabetes: 5.7 - 6.4         Diabetes: >6.4         Glycemic control for adults with diabetes: <7.0    Mean Plasma Glucose 137 mg/dL    Comment: (NOTE) Performed At: [facility] [place], [place] [id number] Lindon Romp MD QQ:[id number]   CBC     Status: Abnormal   Collection Time: 01/23/15  3:46 AM  Result Value Ref Range   WBC 8.6 4.0 - 10.5 K/uL   RBC 3.04 (L) 4.22 - 5.81 MIL/uL   Hemoglobin 8.2 (L) 13.0 - 17.0 g/dL   HCT 25.6 (L) 39.0 - 52.0 %   MCV 84.2 78.0 - 100.0 fL   MCH 27.0 26.0 - 34.0 pg   MCHC 32.0 30.0 - 36.0 g/dL   RDW 14.1 11.5 - 15.5 %   Platelets 265 150 - 400 K/uL  Comprehensive metabolic panel     Status: Abnormal   Collection Time: 01/23/15  3:46 AM  Result Value Ref Range   Sodium 138 135 - 145 mmol/L   Potassium 3.6 3.5 - 5.1 mmol/L   Chloride 105 101 - 111 mmol/L   CO2 26 22 - 32 mmol/L   Glucose, Bld 282 (H) 65 - 99 mg/dL   BUN 19 6 - 20 mg/dL   Creatinine, Ser 0.74 0.61 - 1.24 mg/dL   Calcium 7.9 (L) 8.9 - 10.3 mg/dL   Total Protein 4.5 (L) 6.5 - 8.1 g/dL   Albumin 1.5 (L) 3.5 - 5.0 g/dL   AST 11 (L) 15 - 41 U/L   ALT 9 (L) 17 - 63 U/L   Alkaline Phosphatase 79 38 - 126 U/L   Total Bilirubin 0.5 0.3 - 1.2 mg/dL   GFR calc non Af Amer >60 >60 mL/min   GFR calc Af Amer >60 >60 mL/min    Comment: (NOTE) The eGFR has been calculated using the CKD EPI equation. This calculation has not been validated in all clinical situations. eGFR's persistently <60 mL/min signify  possible Chronic Kidney Disease.    Anion gap 7 5 - 15    Current Facility-Administered Medications  Medication Dose Route Frequency Provider Last Rate Last Dose  . acetaminophen (TYLENOL) tablet 650 mg  650 mg Oral Q6H PRN Edwin Dada, MD   650 mg at 01/22/15 2242   Or  . acetaminophen (TYLENOL) suppository 650 mg  650 mg Rectal Q6H PRN Edwin Dada, MD      . allopurinol (ZYLOPRIM) tablet 300 mg  300 mg Oral Daily Thurnell Lose, MD   300 mg at 01/22/15 1745  . DULoxetine (CYMBALTA) DR capsule 30 mg  30 mg Oral Daily Cloria Spring, MD   30 mg at 01/22/15 1745  . enoxaparin (LOVENOX) injection 40 mg  40 mg Subcutaneous Q24H Edwin Dada, MD   40 mg at 01/22/15 1042  . ondansetron (ZOFRAN) tablet 4 mg  4 mg Oral Q6H PRN Edwin Dada, MD       Or  . ondansetron (ZOFRAN) injection 4 mg  4 mg Intravenous Q6H PRN Edwin Dada, MD      . oxyCODONE (Oxy IR/ROXICODONE) immediate release tablet 5 mg  5 mg Oral Q4H PRN Harrell Gave  Shirleen Schirmer, MD   5 mg at 01/23/15 0553  . pantoprazole (PROTONIX) EC tablet 40 mg  40 mg Oral Daily Edwin Dada, MD   40 mg at 01/22/15 1042  . risperiDONE (RISPERDAL) tablet 2 mg  2 mg Oral QHS Edwin Dada, MD   2 mg at 01/22/15 2150  . traZODone (DESYREL) tablet 50-100 mg  50-100 mg Oral QHS PRN Edwin Dada, MD        Musculoskeletal: Strength & Muscle Tone: decreased Gait & Station: unable to stand Patient leans: N/A  Psychiatric Specialty Exam: Review of Systems :   Blood pressure 122/72, pulse 87, temperature 97.5 F (36.4 C), temperature source Oral, resp. rate 16, height 5' 2"  (1.575 m), weight 68.266 kg (150 lb 8 oz), SpO2 97 %.Body mass index is 27.52 kg/(m^2).  General Appearance: Disheveled, decreased psychomotor activity   Eye Contact::  Fair  Speech:  Slow  Volume:  Decreased  Mood:  Depressed    Affect:  Congruent and Constricted  Thought Process:  Coherent and Goal  Directed  Orientation:  Full (Time, Place, and Person)  Thought Content:  WDL  Suicidal Thoughts:  No  Homicidal Thoughts:  No  Memory:  Immediate;   Fair Recent;   Fair Remote;   Fair  Judgement:  Impaired  Insight:  Lacking  Psychomotor Activity:  Decreased  Concentration:  Fair  Recall:  Poor  Fund of Knowledge:Poor  Language: Fair  Akathisia:  No  Handed:  Right  AIMS (if indicated):     Assets:  Social Support  ADL's:  Impaired  Cognition: WNL  Sleep:      Treatment Plan Summary: Daily contact with patient to assess and evaluate symptoms and progress in treatment and Medication management  Disposition: Patient does not meet criteria for psychiatric inpatient admission.  We'll continue Cymbalta 30 mg daily and may increase to 40 mg or 60 mg as clinically required for depression Continue risperidone 2 mg at bedtime for mood swings and trazodone 50 mg for insomnia  Monitor for manic switch and May use mood stabilizer if needed due to history of bipolar disorder Appreciate psychiatric consultation and follow up as clinically required Please contact [phone] or [phone] if needs further assistance Refer to the unit social worker regarding out-of-home placement especially skilled nursing facility as he was not able to care for himself.  Aivah Putman,JANARDHAHA R. 01/23/2015 10:04 AM

## 2015-01-23 NOTE — Progress Notes (Signed)
Initial Nutrition Assessment  INTERVENTION:  Recommend providing 500 mg of Vitamin C BID for 14 days  Provide 30 ml Pro-stat BID, each dose provides 100 kcal and 15 grams of protein Provide Multivitamin with minerals daily Monitor PO intake for adequacy   NUTRITION DIAGNOSIS:   Increased nutrient needs related to wound healing as evidenced by estimated needs.   GOAL:   Patient will meet greater than or equal to 90% of their needs   MONITOR:   PO intake, Supplement acceptance, Labs, Weight trends, Skin, I & O's  REASON FOR ASSESSMENT:   Consult Assessment of nutrition requirement/status  ASSESSMENT:   44 y.o. male with a past medical history significant for severe depression, severe PCM, T2DM, HTN, and GERD who presents with sacral osteomyelitis.  Pt states that his appetite has been good and he has been eating well with 3 daily meals. He reports eating fruits and vegetables at most meals and protein-rich foods at some meals. He denies any weight loss, stating that he usually weighs 138 lbs and that his weight is up due to receiving fluids on admission. Per nursing notes, pt is eating 100% of meals.   Labs: low hemoglobin, low calcium  Diet Order:  Diet regular Room service appropriate?: Yes; Fluid consistency:: Thin  Skin:  Wound (see comment) (Stage IV pressure ulcer on sacrum; MASD on groin)  Last BM:  11/28  Height:   Ht Readings from Last 1 Encounters:  01/21/15 5\' 2"  (1.575 m)    Weight:   Wt Readings from Last 1 Encounters:  01/21/15 150 lb 8 oz (68.266 kg)    Ideal Body Weight:  53.6 kg  BMI:  Body mass index is 27.52 kg/(m^2).  Estimated Nutritional Needs:   Kcal:  2100-2300  Protein:  90-100 grams  Fluid:  2.1 L/day  EDUCATION NEEDS:   No education needs identified at this time  Dorothea Ogleeanne Aime Meloche RD, LDN Inpatient Clinical Dietitian Pager: [phone] After Hours Pager: [phone]

## 2015-01-23 NOTE — Evaluation (Signed)
Occupational Therapy Evaluation Patient Details Name: Antonio Jensen: [id number] DOB: [birth year] Today's Date: 01/23/2015    History of Present Illness Antonio Jensen is a 44 y.o. male with a past medical history significant for severe depression, severe PCM, T2DM, HTN, and GERD who presents with sacral osteomyelitis. Pt presents with sacral decub and osteomyelitis as well as generalized weakness   Clinical Impression   Pt reports he was set up for ADLs PTA. Currently pt is overall mod assist for ADLs and mobility. Pt with decreased UB strength and AROM. Recommending SNF for further rehab prior to returning home. Pt would benefit from continued skilled OT in order to maximize independence and safety with LB ADLs, toilet transfers, and UB strengthening.     Follow Up Recommendations  SNF;Supervision/Assistance - 24 hour    Equipment Recommendations  Other (comment) (TBD)    Recommendations for Other Services       Precautions / Restrictions Precautions Precautions: Fall Precaution Comments: sacral wound Restrictions Weight Bearing Restrictions: No      Mobility Bed Mobility Overal bed mobility: Needs Assistance Bed Mobility: Supine to Sit     Supine to sit: Mod assist;HOB elevated;+2 for physical assistance     General bed mobility comments: Pt OOB in chair  Transfers Overall transfer level: Needs assistance Equipment used: Rolling walker (2 wheeled) Transfers: Sit to/from Stand Sit to Stand: Min assist         General transfer comment: Min assist to boost up from chair. Attempted x 2 with success on 2nd attempt. VC for hand placement and technique.    Balance Overall balance assessment: Needs assistance Sitting-balance support: Bilateral upper extremity supported Sitting balance-Leahy Scale: Fair Sitting balance - Comments: Pt initially showed loss of balance posteriorly when sitting but improved over time Postural control: Right lateral  lean Standing balance support: Bilateral upper extremity supported Standing balance-Leahy Scale: Poor Standing balance comment: RW for support                            ADL Overall ADL's : Needs assistance/impaired Eating/Feeding: Set up;Sitting   Grooming: Minimal assistance;Sitting   Upper Body Bathing: Moderate assistance;Sitting   Lower Body Bathing: Moderate assistance;Sit to/from stand   Upper Body Dressing : Moderate assistance;Sitting   Lower Body Dressing: Moderate assistance;Sit to/from stand   Toilet Transfer: Minimal assistance;+2 for safety/equipment;Ambulation;BSC;RW (BSC over toilet)   Toileting- Clothing Manipulation and Hygiene: Moderate assistance;Sit to/from stand       Functional mobility during ADLs: Minimal assistance;+2 for safety/equipment;Rolling walker General ADL Comments: No family present during OT eval.      Vision     Perception     Praxis      Pertinent Vitals/Pain Pain Assessment: 0-10 Pain Score: 8  Pain Location: lower back and sacrum Pain Descriptors / Indicators: Aching Pain Intervention(s): Limited activity within patient's tolerance;Monitored during session     Hand Dominance Right   Extremity/Trunk Assessment Upper Extremity Assessment Upper Extremity Assessment: RUE deficits/detail;LUE deficits/detail RUE Deficits / Details: Decreased shoulder AROM 3/4. Decreased strength overall 3/5 with decreased grip strength  LUE Deficits / Details: Decreased shoulder AROM 3/4. Decreased strength overall 3/5 with decreased grip strength. LUE slightly worse overall than RUE.    Lower Extremity Assessment Lower Extremity Assessment: Defer to PT evaluation RLE Deficits / Details: DF 4/5 bilat, pt was 3+/5 bilat in all other motions   Cervical / Trunk Assessment Cervical / Trunk Assessment: Kyphotic  Communication Communication Communication: No difficulties   Cognition Arousal/Alertness: Awake/alert Behavior During  Therapy: Flat affect Overall Cognitive Status: Within Functional Limits for tasks assessed                     General Comments       Exercises       Shoulder Instructions      Home Living Family/patient expects to be discharged to:: Skilled nursing facility Living Arrangements: Spouse/significant other Available Help at Discharge: Family Type of Home: House Home Access: Stairs to enter Entergy CorporationEntrance Stairs-Number of Steps: 4   Home Layout: One level     Bathroom Shower/Tub: Chief Strategy OfficerTub/shower unit   Bathroom Toilet: Standard     Home Equipment: Wheelchair - Fluor Corporationmanual;Walker - 2 wheels;Shower seat;Hospital bed          Prior Functioning/Environment Level of Independence: Needs assistance  Gait / Transfers Assistance Needed: assist to get in and out of bed, assist for all transfers from wife or friends ADL's / Homemaking Assistance Needed: pt required setup but states he was bathing/dressing on his own at chair level. Family does the homemaking        OT Diagnosis: Generalized weakness;Acute pain   OT Problem List: Decreased strength;Decreased range of motion;Decreased activity tolerance;Impaired balance (sitting and/or standing);Decreased safety awareness;Decreased knowledge of use of DME or AE;Decreased knowledge of precautions;Pain   OT Treatment/Interventions: Self-care/ADL training;Therapeutic exercise;DME and/or AE instruction;Patient/family education;Therapeutic activities    OT Goals(Current goals can be found in the care plan section) Acute Rehab OT Goals Patient Stated Goal: to go to rehab then home OT Goal Formulation: With patient Time For Goal Achievement: 02/06/15 Potential to Achieve Goals: Good ADL Goals Pt Will Perform Grooming: with min guard assist;standing Pt Will Perform Lower Body Bathing: with min guard assist;sit to/from stand (with or without AE) Pt Will Perform Lower Body Dressing: with min guard assist;sit to/from stand (with or without AE) Pt  Will Transfer to Toilet: with min guard assist;ambulating;bedside commode (BSC over toilet) Pt Will Perform Toileting - Clothing Manipulation and hygiene: with min guard assist;sit to/from stand Pt/caregiver will Perform Home Exercise Program: Increased strength;With theraband;Independently;With written HEP provided  OT Frequency: Min 2X/week   Barriers to D/C: Decreased caregiver support          Co-evaluation              End of Session Equipment Utilized During Treatment: Gait belt;Rolling walker  Activity Tolerance: Patient limited by pain Patient left: in chair;with call bell/phone within reach;with chair alarm set   Time: [id number] OT Time Calculation (min): 13 min Charges:  OT General Charges $OT Visit: 1 Procedure OT Evaluation $Initial OT Evaluation Tier I: 1 Procedure G-Codes:     Gaye AlkenBailey A Annica Marinello M.S., OTR/L Pager: [phone]  01/23/2015, 10:38 AM

## 2015-01-23 NOTE — Progress Notes (Signed)
Peripherally Inserted Central Catheter/Midline Placement  The IV Nurse has discussed with the patient and/or persons authorized to consent for the patient, the purpose of this procedure and the potential benefits and risks involved with this procedure.  The benefits include less needle sticks, lab draws from the catheter and patient may be discharged home with the catheter.  Risks include, but not limited to, infection, bleeding, blood clot (thrombus formation), and puncture of an artery; nerve damage and irregular heat beat.  Alternatives to this procedure were also discussed.  PICC/Midline Placement Documentation        Lisabeth DevoidGibbs, Amrita Radu Jeanette 01/23/2015, 11:47 AM Consent obtained by Reginia FortsMarilyn Lumban, RN

## 2015-01-23 NOTE — Progress Notes (Signed)
Request for biopsy/culture of coccyx for osteomyelitis. Imaging reviewed, pt has stage IV open sacral/coccyx wound. Bone is exposed. Image guided tissue sampling not indicated. Recommend surgical eval for debridement or sampling of exposed bone. Dr. Bonnielee HaffHoss has d/w attending MD.  Brayton ElKevin Zakhi Dupre PA-C Interventional Radiology 01/23/2015 8:44 AM

## 2015-01-23 NOTE — Evaluation (Signed)
Physical Therapy Evaluation Patient Details Name: Antonio Jensen MRN: [id number] DOB: 06/08/1970 Today's Date: 01/23/2015   History of Present Illness  Antonio Jensen is a 44 y.o. male with a past medical history significant for severe depression, severe PCM, T2DM, HTN, and GERD who presents with sacral osteomyelitis. Pt presents with sacral decub and osteomyelitis as well as generalized weakness  Clinical Impression  Pt demonstrated flat affect but was willing to perform amb/exercises. He presents with bilat LE weakness and decreased LE endurance that impairs his ability to transfer without assistance and ambulate functional distances. Education on positioning to avoid prolonged pressure, importance of ambulation with staff, and POC, pt verbalized agreement. Would benefit from acute PT to improve safety with transfers, LE strength, and functional mobility to decrease burden of care. D/C to SNF is most appropriate at this time.     Follow Up Recommendations SNF;Supervision/Assistance - 24 hour    Equipment Recommendations  None recommended by PT    Recommendations for Other Services OT consult     Precautions / Restrictions Precautions Precautions: Fall Precaution Comments: sacral wound Restrictions Weight Bearing Restrictions: No      Mobility  Bed Mobility Overal bed mobility: Needs Assistance Bed Mobility: Supine to Sit     Supine to sit: Mod assist;HOB elevated;+2 for physical assistance     General bed mobility comments: Mod assist for trunk elevation, scooting to EOB. Required VC for hand placement, sequencing, and to avoid grabbing PT for assistance  Transfers Overall transfer level: Needs assistance   Transfers: Sit to/from Stand Sit to Stand: Mod assist         General transfer comment: Mod assist to power up over rounded EOB on special inflatable mattress. VC to push up from bed  Ambulation/Gait Ambulation/Gait assistance: Min guard Ambulation  Distance (Feet): 30 Feet Assistive device: Rolling walker (2 wheeled) Gait Pattern/deviations: Step-through pattern;Decreased stride length   Gait velocity interpretation: Below normal speed for age/gender General Gait Details: Pt reported soreness in bilat gastroc, became too faitgued to continue at 30 ft. Chair to follow  Stairs            Wheelchair Mobility    Modified Rankin (Stroke Patients Only)       Balance Overall balance assessment: Needs assistance Sitting-balance support: Bilateral upper extremity supported Sitting balance-Leahy Scale: Fair Sitting balance - Comments: Pt initially showed loss of balance posteriorly when sitting but improved over time Postural control: Right lateral lean                                   Pertinent Vitals/Pain Pain Assessment: 0-10 Pain Score: 8  Pain Location: sacrum Pain Intervention(s): Repositioned    Home Living Family/patient expects to be discharged to:: Skilled nursing facility Living Arrangements: Spouse/significant other Available Help at Discharge: Family Type of Home: House Home Access: Stairs to enter   Secretary/administrator of Steps: 4 Home Layout: One level Home Equipment: Wheelchair - [employer] - 2 wheels;Shower seat;Hospital bed      Prior Function Level of Independence: Needs assistance   Gait / Transfers Assistance Needed: assist to get in and out of bed, assist for all transfers from wife or friends  ADL's / Homemaking Assistance Needed: pt required setup but states he was bathing/dressing on his own at chair level. Family does the homemaking        Hand Dominance   Dominant Hand: Right  Extremity/Trunk Assessment   Upper Extremity Assessment: Generalized weakness           Lower Extremity Assessment: Generalized weakness;RLE deficits/detail;LLE deficits/detail RLE Deficits / Details: DF 4/5 bilat, pt was 3+/5 bilat in all other motions    Cervical / Trunk  Assessment: Kyphotic  Communication   Communication: No difficulties  Cognition Arousal/Alertness: Awake/alert Behavior During Therapy: Flat affect Overall Cognitive Status: Within Functional Limits for tasks assessed                      General Comments      Exercises General Exercises - Lower Extremity Long Arc Quad: AROM;Both;15 reps;Seated Hip Flexion/Marching: AROM;15 reps;Seated;Both      Assessment/Plan    PT Assessment Patient needs continued PT services  PT Diagnosis Difficulty walking;Generalized weakness;Acute pain   PT Problem List Decreased strength;Decreased range of motion;Decreased activity tolerance;Decreased safety awareness;Decreased knowledge of use of DME;Decreased balance;Pain;Decreased mobility  PT Treatment Interventions Gait training;DME instruction;Functional mobility training;Therapeutic activities;Stair training;Balance training;Therapeutic exercise;Patient/family education   PT Goals (Current goals can be found in the Care Plan section) Acute Rehab PT Goals Patient Stated Goal: return to walking around the house PT Goal Formulation: With patient Time For Goal Achievement: 02/06/15 Potential to Achieve Goals: Fair    Frequency Min 3X/week   Barriers to discharge Decreased caregiver support      Co-evaluation               End of Session Equipment Utilized During Treatment: Gait belt Activity Tolerance: Patient tolerated treatment well Patient left: in chair;with call bell/phone within reach;with chair alarm set Nurse Communication: Mobility status;Precautions         Time: [id number] PT Time Calculation (min) (ACUTE ONLY): 30 min   Charges:   PT Evaluation $Initial PT Evaluation Tier I: 1 Procedure PT Treatments $Gait Training: 8-22 mins   PT G CodesJimmy Picket:        Sohana Austell 01/23/2015, 10:12 AM Jimmy PicketJustin Shenise Wolgamott, SPT 01/23/2015 10:12 AM

## 2015-01-23 NOTE — Progress Notes (Signed)
ANTIBIOTIC CONSULT NOTE - INITIAL  Pharmacy Consult for Vancomycin/ceftriaxone Indication: Wound infection, sacral osteomyelitis  Allergies  Allergen Reactions  . Sulfa Antibiotics    Patient Measurements: Height: 5\' 2"  (157.5 cm) Weight: 150 lb 8 oz (68.266 kg) IBW/kg (Calculated) : 54.6  Vital Signs: Temp: 97.5 F (36.4 C) (11/28 0529) Temp Source: Oral (11/28 0529) BP: 122/72 mmHg (11/28 0529) Pulse Rate: 87 (11/28 0529)   Assessment: 44 y/o M transfer from [facility] with recurrent sacral osteomyelitis/wound infection.  Recently treated with one [facility] of antibiotics [facility], off antibiotics for 2 weeks. Readmitted for possible osteomyelitis. MRI confirms coccygeal osteomyelitis. He was restarted on vancomycin and zosyn yesterday then abx discontinued, to restart vancomycin and ceftriaxone today per ID recommendations.  Plan is for 6 wks of IV vancomycin and ceftriaxone via PICC.  Tmax 100, wbc normal at 8.6, scr appears normal at 0.7.  Goal of Therapy:  Vancomycin trough level 15-20 mcg/ml  Plan:  -Restart Vancomycin 750 mg IV q8h -Ceftriaxone 2g q24 hours -Trend WBC, temp, renal function  -Vancomycin level at steady state  Sheppard CoilFrank Wilson PharmD., BCPS Clinical Pharmacist Pager [phone] 01/23/2015 10:56 AM

## 2015-01-23 NOTE — Progress Notes (Signed)
Patient Demographics:    Antonio Jensen, is a 44 y.o. male, DOB - 1971-02-18, ZOX:[id number]  Admit date - 01/21/2015   Admitting Physician [name], MD  Outpatient Primary MD for the patient is No primary care provider on file.  LOS - 2   No chief complaint on file.       Subjective:    Antonio Adeimothy Dumm today has, No headache, No chest pain, No abdominal pain - No Nausea, No new weakness tingling or numbness, No Cough - SOB. Continues to have dull low back pain.   Assessment  & Plan :     1. 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 Recently treated with one month of antibiotics at [facility], off antibiotics for 2 weeks. Readmitted for possible osteomyelitis. MRI confirms coccygeal osteomyelitis. Discussed with IR Dr. Bonnielee HaffHoss who was unable to do biopsy as risk of neurological injury is high.   Discussed his case with ID physician Dr. Drue SecondSnider, we will be placed on 6 weeks of IV vancomycin along with Rocephin via PICC line. Continue wound care outpatient ID follow-up.   2. Generalized weakness and deconditioning. Discussed with wife in detail, patient until June of this year was driving and walking, thereafter became progressively weak due to lack of activity which came about after he developed a sacral ulcer due to itching. Initiated PT and SNF placement.   3. Bipolar disorder with depression. Currently on trazodone, risperidone which will be continued.   4. GERD. On PPI continue.   Code Status : Full  Family Communication  : Wife over the phone  Disposition Plan  : SNF in 1-2 days  Consults  :  ID physician Dr. Drue SecondSnider over the phone, IR Dr.  Bonnielee HaffHoss  Procedures  : MRI sacrum, PICC line requested  DVT Prophylaxis  :  Lovenox   Lab Results  Component Value Date   PLT 265 01/23/2015    Inpatient Medications  Scheduled Meds: . allopurinol  300 mg Oral Daily  . cefTRIAXone (ROCEPHIN)  IV  2 g Intravenous Q24H  . DULoxetine  30 mg Oral Daily  . enoxaparin (LOVENOX) injection  40 mg Subcutaneous Q24H  . pantoprazole  40 mg Oral Daily  . risperiDONE  2 mg Oral QHS   Continuous Infusions:  PRN Meds:.acetaminophen **OR** acetaminophen, ondansetron **OR** ondansetron (ZOFRAN) IV, oxyCODONE, traZODone  Antibiotics  :     Anti-infectives    Start     Dose/Rate Route Frequency Ordered Stop   01/23/15 1045  cefTRIAXone (ROCEPHIN) 2 g in dextrose 5 % 50 mL IVPB     2 g 100 mL/hr over 30 Minutes Intravenous Every 24 hours 01/23/15 1044     01/22/15 0100  vancomycin (VANCOCIN) IVPB 750 mg/150 ml premix  Status:  Discontinued     750 mg 150 mL/hr over 60 Minutes Intravenous Every 8 hours 01/22/15 0008 01/22/15 0931   01/22/15 0015  piperacillin-tazobactam (ZOSYN) IVPB 3.375 g  Status:  Discontinued     3.375 g 12.5 mL/hr over 240 Minutes Intravenous 3 times per day 01/22/15 0008 01/22/15 0931        Objective:  Filed Vitals:   01/21/15 2240 01/22/15 0516 01/22/15 1932 01/23/15 0529  BP: 109/58 121/70 141/75 122/72  Pulse: 88 88 101 87  Temp: 98.7 F (37.1 C) 97.7 F (36.5 C) 100 F (37.8 C) 97.5 F (36.4 C)  TempSrc: Oral Oral Oral Oral  Resp: Height:  (1.575 m)     Weight: 68.266 kg (150 lb 8 oz)     SpO2: 98% 98% 97% 97%    Wt Readings from Last 3 Encounters:  01/21/15 68.266 kg (150 lb 8 oz)     Intake/Output Summary (Last 24 hours) at 01/23/15 1044 Last data filed at 01/23/15 0532  Gross per 24 hour  Intake      0 ml  Output    600 ml  Net   -600 ml     Physical Exam  Awake Alert, Oriented X 3, No new F.N deficits, Normal affect [place].AT,PERRAL Supple Neck,No JVD, No cervical  lymphadenopathy appriciated.  Symmetrical Chest wall movement, Good air movement bilaterally, CTAB RRR,No Gallops,Rubs or new Murmurs, No Parasternal Heave +ve B.Sounds, Abd Soft, No tenderness, No organomegaly appriciated, No rebound - guarding or rigidity. No Cyanosis, Clubbing or edema, No new Rash or bruise  Stage IV sacral decubitus ulcer kindly see wound care note for all details    Data Review:   Micro Results No results found for this or any previous visit (from the past 240 hour(s)).  Radiology Reports Mr Pelvis W Wo Contrast  01/22/2015  CLINICAL DATA:  Sacral decubitus ulcer which developed over the past few months. History of prior debridement of the ulcer. Pain and drainage from the wound. The patient has completed a month of antibiotic therapy over the course of the last 6 weeks for the ulcer. Initial encounter. EXAM: MRI PELVIS WITHOUT AND WITH CONTRAST TECHNIQUE: Multiplanar multisequence MR imaging of the pelvis was performed both before and after administration of intravenous contrast. CONTRAST:  10 mL MULTIHANCE GADOBENATE DIMEGLUMINE 529 MG/ML IV SOLN COMPARISON:  CT abdomen and pelvis 01/21/2015. FINDINGS: This study is markedly degraded by patient motion. As seen on the comparison CT scan, there is a large sacral decubitus ulcer measuring approximately 3.4 cm transverse by 5.5 cm craniocaudal. The ulcer appears to extend to bone at the coccyx. No abscess is identified. Although difficult to visualize secondary to motion, there appears to be edema and enhancement within the coccyx compatible with osteomyelitis. Bone marrow signal is otherwise unremarkable. Extensive subcutaneous edema is noted. No intrapelvic fluid collection is seen. IMPRESSION: Markedly limited examination demonstrating a large sacral decubitus ulcer which appears to extend to bone at the coccyx with osteomyelitis of the coccyx. No abscess is identified. Diffuse subcutaneous edema compatible with are anasarca.  Electronically Signed   By: Drusilla Kanner M.D.   On: 01/22/2015 14:42     CBC  Recent Labs Lab 01/22/15 0002 01/23/15 0346  WBC 8.1 8.6  HGB 8.7* 8.2*  HCT 27.1* 25.6*  PLT 128* 265  MCV 83.1 84.2  MCH 26.7 27.0  MCHC 32.1 32.0  RDW 14.1 14.1    Chemistries   Recent Labs Lab 01/22/15 0002 01/22/15 0350 01/23/15 0346  NA  --  138 138  K  --  3.7 3.6  CL  --  107 105  CO2  --  27 26  GLUCOSE  --  157* 282*  BUN  --  25* 19  CREATININE 0.67 0.58* 0.74  CALCIUM  --  8.1* 7.9*  AST  --  10* 11*  ALT  --  9* 9*  ALKPHOS  --  81 79  BILITOT  --  0.2* 0.5   ------------------------------------------------------------------------------------------------------------------ estimated creatinine clearance is 98.1 mL/min (by C-G formula based on Cr of 0.74). ------------------------------------------------------------------------------------------------------------------  Recent Labs  01/22/15 0350  HGBA1C 6.4*   ------------------------------------------------------------------------------------------------------------------ No results for input(s): CHOL, HDL, LDLCALC, TRIG, CHOLHDL, LDLDIRECT in the last 72 hours. ------------------------------------------------------------------------------------------------------------------ No results for input(s): TSH, T4TOTAL, T3FREE, THYROIDAB in the last 72 hours.  Invalid input(s): FREET3 ------------------------------------------------------------------------------------------------------------------ No results for input(s): VITAMINB12, FOLATE, FERRITIN, TIBC, IRON, RETICCTPCT in the last 72 hours.  Coagulation profile No results for input(s): INR, PROTIME in the last 168 hours.  No results for input(s): DDIMER in the last 72 hours.  Cardiac Enzymes No results for input(s): CKMB, TROPONINI, MYOGLOBIN in the last 168 hours.  Invalid input(s):  CK ------------------------------------------------------------------------------------------------------------------ Invalid input(s): POCBNP   Time Spent in minutes  35   Iana Buzan K M.D on 01/23/2015 at 10:44 AM  Between 7am to 7pm - Pager - [phone]  After 7pm go to [email or web address] - password [facility]  [facility] -  Office  [phone]

## 2015-01-23 NOTE — Progress Notes (Signed)
Inpatient Diabetes Program Recommendations  AACE/ADA: New Consensus Statement on Inpatient Glycemic Control (2015)  Target Ranges:  Prepandial:   less than 140 mg/dL      Peak postprandial:   less than 180 mg/dL (1-2 hours)      Critically ill patients:  140 - 180 mg/dL   Review of Glycemic Control:  Results for Flo ShanksJENKINS, Teran J (MRN [id number]) as of 01/23/2015 09:08  Ref. Range 01/22/2015 03:50 01/23/2015 03:46  Glucose Latest Ref Range: 65-99 mg/dL 409157 (H) 811282 (H)   Diabetes history:None noted Outpatient Diabetes medications: none Inpatient Diabetes Program Recommendations:  Please consider checking blood sugars tid with meals and HS.  If greater than 140 mg/dL, consider adding Novolog correction.  Also may consider checking A1C.  Thanks, Beryl MeagerJenny Jasslyn Finkel, RN, BC-ADM Inpatient Diabetes Coordinator Pager [phone] (8a-5p)

## 2015-01-24 MED ORDER — DULOXETINE HCL 30 MG PO CPEP: 30.0000 mg | ORAL_CAPSULE | Freq: Every day | ORAL | Status: AC

## 2015-01-24 MED ORDER — DEXTROSE 5 % IV SOLN: 2.0000 g | INTRAVENOUS | Status: AC

## 2015-01-24 MED ORDER — PRO-STAT SUGAR FREE PO LIQD: 30.0000 mL | Freq: Two times a day (BID) | ORAL | Status: AC

## 2015-01-24 MED ORDER — VANCOMYCIN HCL IN DEXTROSE 750-5 MG/150ML-% IV SOLN: 750.0000 mg | Freq: Three times a day (TID) | INTRAVENOUS | Status: DC

## 2015-01-24 MED ORDER — OXYCODONE HCL 5 MG PO CAPS: 5.0000 mg | ORAL_CAPSULE | Freq: Four times a day (QID) | ORAL | Status: AC | PRN

## 2015-01-24 NOTE — Discharge Summary (Addendum)
Antonio Jensen, is a 44 y.o. male  DOB April 09, 1970  MRN [id number].  Admission date:  01/21/2015  Admitting Physician  Inez Catalina, MD  Discharge Date:  01/25/2015   Primary MD  No primary care provider on file.  Recommendations for primary care physician for things to follow:   CBC BMP in a week, needs close outpatient follow-up with ID as listed below .   Admission Diagnosis  osteomyelitis   Discharge Diagnosis  osteomyelitis     Principal Problem:   Bipolar depression (HCC) Active Problems:   Pressure ulcer stage IV (HCC)   Osteomyelitis (HCC)   Essential hypertension      Past Medical History  Diagnosis Date  . Hypertension   . Bipolar disorder (HCC)   . GERD (gastroesophageal reflux disease)   . Diabetes mellitus without complication [facility])     Past Surgical History  Procedure Laterality Date  . Boil i&d  unknown    hx provided by wife   . Testical i&d/boil  unknown    hx provided by wife   . Cardiac cathertization no stent placed  unknown     hx provided by wife        HPI  from the history and physical done on the day of admission:    Antonio Jensen is a 44 y.o. male with a past medical history significant for severe depression, severe PCM, T2DM, HTN, and GERD who presents with sacral osteomyelitis.  The patient is a poor historian, and so most history is collected from outside ED MD. He developed a decubitus ulcer on the left buttock over the last few months, he states, that has gotten worse and worse. Per outside provider, the patient was admitted for an abscess of the decubitus ulcer about 6 weeks ago. The ulcer was provided and he was treated with antibiotics for almost a month at [facility] and then discharged 2 weeks ago.  He was at home, but returned to the  hospital because his wife noticed drainage from the wound and it was more painful. There is no bone visible in the wound, and [facility] were asked to admit because of availability of infectious disease consultation. In the ED there, the patient was afebrile with mild leukocytosis and hemodynamically stable.  To me he endorses sitting in his recliner at home and not being able to do much of anything else. He endorses pain. He is not forthcoming with other details and mostly denies questions (fever, chills, pain elsewhere) or says "I don't know".     Hospital Course:     1. 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 Recently treated with one month of antibiotics at [facility], off antibiotics for 2 weeks. Readmitted for possible osteomyelitis. MRI confirms coccygeal osteomyelitis. Discussed with IR Dr. Bonnielee Haff who was unable to do biopsy as risk of neurological injury is high.   Discussed his case with  ID physician Dr. Drue SecondSnider, we will be placed on 6 weeks of IV vancomycin along with Rocephin via PICC line. Right arm PICC line placed. Antibiotic start date was 01/23/2015. He must follow with ID within the next 2 weeks, antibiotics will be stopped by ID based on his clinical response. Tentatively total 6 weeks. SNF pharmacy to monitor Vancomycin levels     2. Generalized weakness and deconditioning. Discussed with wife in detail, patient until June of this year was driving and walking, thereafter became progressively weak due to lack of activity which came about after he developed a sacral ulcer due to itching. Initiated PT and SNF placement.    3. Bipolar disorder with depression. Currently on trazodone, risperidone which will be continued at his home dose. Psych saw the patient and added Cymbalta which will be continued upon discharge as well.    4. GERD. On PPI  continue.    5. Chronic indwelling catheter placed at [facility] a few weeks ago, change every month, outpatient urology follow-up in 1-2 weeks.     Discharge Condition: Stable  Follow UP  Follow-up Information    Follow up with Judyann MunsonSNIDER, CYNTHIA, MD. Schedule an appointment as soon as possible for a visit in 1 week.   Specialty:  Infectious Diseases   Why:  Osteomyelitis   Contact information:   [address] [place] [address] [phone]       Follow up with PCP. Schedule an appointment as soon as possible for a visit in 1 week.       Consults obtained -  ID physician Dr. Drue SecondSnider over the phone, IR Dr. Bonnielee HaffHoss  Diet and Activity recommendation: See Discharge Instructions below  Discharge Instructions           Discharge Instructions    Diet - low sodium heart healthy    Complete by:  As directed      Discharge instructions    Complete by:  As directed   Follow with Primary MD No primary care provider on file. in 7 days   Get CBC, CMP, 2 view Chest X ray checked  by Primary MD next visit.    Activity: As tolerated with Full fall precautions use walker/cane & assistance as needed   Disposition SNF   Diet: Heart Healthy  with feeding assistance and aspiration precautions.  For Heart failure patients - Check your Weight same time everyday, if you gain over 2 pounds, or you develop in leg swelling, experience more shortness of breath or chest pain, call your Primary MD immediately. Follow Cardiac Low Salt Diet and 1.5 lit/day fluid restriction.   On your next visit with your primary care physician please Get Medicines reviewed and adjusted.   Please request your Prim.MD to go over all Hospital Tests and Procedure/Radiological results at the follow up, please get all Hospital records sent to your Prim MD by signing hospital release before you go home.   If you experience worsening of your admission symptoms, develop shortness of breath,  life threatening emergency, suicidal or homicidal thoughts you must seek medical attention immediately by calling 911 or calling your MD immediately  if symptoms less severe.  You Must read complete instructions/literature along with all the possible adverse reactions/side effects for all the Medicines you take and that have been prescribed to you. Take any new Medicines after you have completely understood and accpet all the possible adverse reactions/side effects.   Do not drive, operating heavy machinery, perform activities  at heights, swimming or participation in water activities or provide baby sitting services if your were admitted for syncope or siezures until you have seen by Primary MD or a Neurologist and advised to do so again.  Do not drive when taking Pain medications.    Do not take more than prescribed Pain, Sleep and Anxiety Medications  Special Instructions: If you have smoked or chewed Tobacco  in the last 2 yrs please stop smoking, stop any regular Alcohol  and or any Recreational drug use.  Wear Seat belts while driving.   Please note  You were cared for by a hospitalist during your hospital stay. If you have any questions about your discharge medications or the care you received while you were in the hospital after you are discharged, you can call the unit and asked to speak with the hospitalist on call if the hospitalist that took care of you is not available. Once you are discharged, your primary care physician will handle any further medical issues. Please note that NO REFILLS for any discharge medications will be authorized once you are discharged, as it is imperative that you return to your primary care physician (or establish a relationship with a primary care physician if you do not have one) for your aftercare needs so that they can reassess your need for medications and monitor your lab values.     Increase activity slowly    Complete by:  As directed               Discharge Medications       Medication List    TAKE these medications        allopurinol 300 MG tablet  Commonly known as:  ZYLOPRIM  Take 300 mg by mouth daily.     cefTRIAXone 2 g in dextrose 5 % 50 mL  Inject 2 g into the vein daily.     DULoxetine 30 MG capsule  Commonly known as:  CYMBALTA  Take 1 capsule (30 mg total) by mouth daily.     feeding supplement (PRO-STAT SUGAR FREE 64) Liqd  Take 30 mLs by mouth 2 (two) times daily.     furosemide 20 MG tablet  Commonly known as:  LASIX  Take 20 mg by mouth daily.     lisinopril-hydrochlorothiazide 10-12.5 MG tablet  Commonly known as:  PRINZIDE,ZESTORETIC  Take 1 tablet by mouth daily.     omeprazole 40 MG capsule  Commonly known as:  PRILOSEC  Take 40 mg by mouth 2 (two) times daily.     oxycodone 5 MG capsule  Commonly known as:  OXY-IR  Take 1 capsule (5 mg total) by mouth every 6 (six) hours as needed for pain.     risperiDONE 2 MG tablet  Commonly known as:  RISPERDAL  Take 2 mg by mouth at bedtime.     traZODone 50 MG tablet  Commonly known as:  DESYREL  Take 50-100 mg by mouth at bedtime as needed for sleep.     vancomycin 500 mg in sodium chloride 0.9 % 100 mL  Inject 500 mg into the vein every 8 (eight) hours.        Major procedures and Radiology Reports - PLEASE review detailed and final reports for all details, in brief -       Mr Pelvis W Wo Contrast  01/22/2015  CLINICAL DATA:  Sacral decubitus ulcer which developed over the past few months. History of prior debridement of the ulcer. Pain  and drainage from the wound. The patient has completed a month of antibiotic therapy over the course of the last 6 weeks for the ulcer. Initial encounter. EXAM: MRI PELVIS WITHOUT AND WITH CONTRAST TECHNIQUE: Multiplanar multisequence MR imaging of the pelvis was performed both before and after administration of intravenous contrast. CONTRAST:  10 mL MULTIHANCE GADOBENATE DIMEGLUMINE 529 MG/ML IV  SOLN COMPARISON:  CT abdomen and pelvis 01/21/2015. FINDINGS: This study is markedly degraded by patient motion. As seen on the comparison CT scan, there is a large sacral decubitus ulcer measuring approximately 3.4 cm transverse by 5.5 cm craniocaudal. The ulcer appears to extend to bone at the coccyx. No abscess is identified. Although difficult to visualize secondary to motion, there appears to be edema and enhancement within the coccyx compatible with osteomyelitis. Bone marrow signal is otherwise unremarkable. Extensive subcutaneous edema is noted. No intrapelvic fluid collection is seen. IMPRESSION: Markedly limited examination demonstrating a large sacral decubitus ulcer which appears to extend to bone at the coccyx with osteomyelitis of the coccyx. No abscess is identified. Diffuse subcutaneous edema compatible with are anasarca. Electronically Signed   By: Drusilla Kanner M.D.   On: 01/22/2015 14:42    Micro Results      No results found for this or any previous visit (from the past 240 hour(s)).     Today   Subjective    Antonio Jensen today has no headache,no chest abdominal pain,no new weakness tingling or numbness, feels much better wants to go home today.     Objective   Blood pressure 120/77, pulse 87, temperature 98.1 F (36.7 C), temperature source Oral, resp. rate 19, height 5\' 2"  (1.575 m), weight 68.266 kg (150 lb 8 oz), SpO2 97 %.   Intake/Output Summary (Last 24 hours) at 01/25/15 1432 Last data filed at 01/25/15 0700  Gross per 24 hour  Intake    240 ml  Output   1150 ml  Net   -910 ml    Exam Awake Alert, Oriented x 3, No new F.N deficits, Normal affect .AT,PERRAL Supple Neck,No JVD, No cervical lymphadenopathy appriciated.  Symmetrical Chest wall movement, Good air movement bilaterally, CTAB RRR,No Gallops,Rubs or new Murmurs, No Parasternal Heave +ve B.Sounds, Abd Soft, Non tender, No organomegaly appriciated, No rebound -guarding or rigidity. No  Cyanosis, Clubbing or edema, No new Rash or bruise, stage 4 sacral decub ulcer see wound care notes for details   Data Review   CBC w Diff:  Lab Results  Component Value Date   WBC 8.6 01/23/2015   HGB 8.2* 01/23/2015   HCT 25.6* 01/23/2015   PLT 265 01/23/2015    CMP:  Lab Results  Component Value Date   NA 136 01/25/2015   K 3.8 01/25/2015   CL 103 01/25/2015   CO2 28 01/25/2015   BUN 12 01/25/2015   CREATININE 0.53* 01/25/2015   PROT 4.5* 01/23/2015   ALBUMIN 1.5* 01/23/2015   BILITOT 0.5 01/23/2015   ALKPHOS 79 01/23/2015   AST 11* 01/23/2015   ALT 9* 01/23/2015  .   Total Time in preparing paper work, data evaluation and todays exam - 35 minutes  Leroy Sea M.D on 01/25/2015 at 2:32 PM  [facility]   Office  [phone]

## 2015-01-24 NOTE — Discharge Instructions (Signed)
Follow with Primary MD No primary care provider on file. in 7 days   Get CBC, CMP, 2 view Chest X ray checked  by Primary MD next visit.    Activity: As tolerated with Full fall precautions use walker/cane & assistance as needed   Disposition SNF   Diet: Heart Healthy  with feeding assistance and aspiration precautions.  For Heart failure patients - Check your Weight same time everyday, if you gain over 2 pounds, or you develop in leg swelling, experience more shortness of breath or chest pain, call your Primary MD immediately. Follow Cardiac Low Salt Diet and 1.5 lit/day fluid restriction.   On your next visit with your primary care physician please Get Medicines reviewed and adjusted.   Please request your Prim.MD to go over all Hospital Tests and Procedure/Radiological results at the follow up, please get all Hospital records sent to your Prim MD by signing hospital release before you go home.   If you experience worsening of your admission symptoms, develop shortness of breath, life threatening emergency, suicidal or homicidal thoughts you must seek medical attention immediately by calling 911 or calling your MD immediately  if symptoms less severe.  You Must read complete instructions/literature along with all the possible adverse reactions/side effects for all the Medicines you take and that have been prescribed to you. Take any new Medicines after you have completely understood and accpet all the possible adverse reactions/side effects.   Do not drive, operating heavy machinery, perform activities at heights, swimming or participation in water activities or provide baby sitting services if your were admitted for syncope or siezures until you have seen by Primary MD or a Neurologist and advised to do so again.  Do not drive when taking Pain medications.    Do not take more than prescribed Pain, Sleep and Anxiety Medications  Special Instructions: If you have smoked or chewed  Tobacco  in the last 2 yrs please stop smoking, stop any regular Alcohol  and or any Recreational drug use.  Wear Seat belts while driving.   Please note  You were cared for by a hospitalist during your hospital stay. If you have any questions about your discharge medications or the care you received while you were in the hospital after you are discharged, you can call the unit and asked to speak with the hospitalist on call if the hospitalist that took care of you is not available. Once you are discharged, your primary care physician will handle any further medical issues. Please note that NO REFILLS for any discharge medications will be authorized once you are discharged, as it is imperative that you return to your primary care physician (or establish a relationship with a primary care physician if you do not have one) for your aftercare needs so that they can reassess your need for medications and monitor your lab values.

## 2015-01-24 NOTE — Clinical Social Work Placement (Addendum)
   CLINICAL SOCIAL WORK PLACEMENT  NOTE  Date:  01/24/2015  Patient Details  Name: Antonio Jensen MRN: [id number] Date of Birth: 07-25-1970  Clinical Social Work is seeking post-discharge placement for this patient at the Skilled  Nursing Facility level of care (*CSW will initial, date and re-position this form in  chart as items are completed):  Yes   Patient/family provided with [place] Clinical Social Work Department's list of facilities offering this level of care within the geographic area requested by the patient (or if unable, by the patient's family).  Yes   Patient/family informed of their freedom to choose among providers that offer the needed level of care, that participate in Medicare, Medicaid or managed care program needed by the patient, have an available bed and are willing to accept the patient.  Yes   Patient/family informed of [place]'s ownership interest in [facility] and [facility], as well as of the fact that they are under no obligation to receive care at these facilities.  PASRR submitted to EDS on 01/24/15     PASRR number received on 01/24/15     Existing PASRR number confirmed on       FL2 transmitted to all facilities in geographic area requested by pt/family on 01/24/15     FL2 transmitted to all facilities within larger geographic area on       Patient informed that his/her managed care company has contracts with or will negotiate with certain facilities, including the following:        Yes   Patient/family informed of bed offers received.  [facility]     Physician recommends and patient chooses bed at      [facility] 01[facility].  Patient to be transferred to facility by [facility]     Patient family notified on 01/24/15 of transfer.  Name of family member notified:  wife, son and patient     PHYSICIAN Please prepare priority discharge summary, including medications      Additional Comment:    _______________________________________________ Rondel BatonIngle, Makael Stein C, LCSW 01/24/2015, 9:48 AM

## 2015-01-24 NOTE — [place] FL2 (Signed)
[place] MEDICAID FL2 LEVEL OF CARE SCREENING TOOL     IDENTIFICATION  Patient Name: Antonio Jensen Birthdate: October 21, 1970 Sex: male Admission Date (Current Location): 01/21/2015  [facility] and [place] Number: [employer] and Address:  [facility] [place] [facility], [address] [place], [place] [address]      Provider Number: [id number]  Attending Physician Name and Address:  Leroy Sea, MD  Relative Name and Phone Number:       Current Level of Care: Hospital Recommended Level of Care: Skilled Nursing Facility Prior Approval Number:    Date Approved/Denied:   PASRR Number: [id number]  Discharge Plan: SNF    Current Diagnoses: Patient Active Problem List   Diagnosis Date Noted  . Pressure ulcer stage IV (HCC) 01/21/2015  . Osteomyelitis (HCC) 01/21/2015  . Essential hypertension 01/21/2015  . Bipolar depression (HCC) 01/21/2015    Orientation ACTIVITIES/SOCIAL BLADDER RESPIRATION         Incontinent Normal  BEHAVIORAL SYMPTOMS/MOOD NEUROLOGICAL BOWEL NUTRITION STATUS      Continent Diet (see DC summary)  PHYSICIAN VISITS COMMUNICATION OF NEEDS Height & Weight Skin    Verbally  (157.5 cm) 150 lbs. PU Stage and Appropriate Care       PU Stage 4 Dressing: BID  AMBULATORY STATUS RESPIRATION    Assist extensive Normal      Personal Care Assistance Level of Assistance  Bathing, Dressing Bathing Assistance: Maximum assistance   Dressing Assistance: Maximum assistance      Functional Limitations Info                SPECIAL CARE FACTORS FREQUENCY  PT (By licensed PT), OT (By licensed OT)     PT Frequency: 5/wk OT Frequency: 5/wk           Additional Factors Info  Code Status, Allergies, Psychotropic Code Status Info: FULL Allergies Info: sulfa antibiotics           Current Medications (01/24/2015):  This is the current hospital active medication list Current Facility-Administered Medications   Medication Dose Route Frequency Provider Last Rate Last Dose  . acetaminophen (TYLENOL) tablet 650 mg  650 mg Oral Q6H PRN Alberteen Sam, MD   650 mg at 01/22/15 2242   Or  . acetaminophen (TYLENOL) suppository 650 mg  650 mg Rectal Q6H PRN Alberteen Sam, MD      . allopurinol (ZYLOPRIM) tablet 300 mg  300 mg Oral Daily Leroy Sea, MD   300 mg at 01/23/15 1029  . cefTRIAXone (ROCEPHIN) 2 g in dextrose 5 % 50 mL IVPB  2 g Intravenous Q24H Leroy Sea, MD   2 g at 01/23/15 1256  . DULoxetine (CYMBALTA) DR capsule 30 mg  30 mg Oral Daily Myrlene Broker, MD   30 mg at 01/23/15 1029  . enoxaparin (LOVENOX) injection 40 mg  40 mg Subcutaneous Q24H Alberteen Sam, MD   40 mg at 01/23/15 1029  . feeding supplement (PRO-STAT SUGAR FREE 64) liquid 30 mL  30 mL Oral BID Reanne J Barbato, RD   1 mL at 01/23/15 1844  . multivitamin with minerals tablet 1 tablet  1 tablet Oral Daily Salem Senate, RD   1 tablet at 01/23/15 1844  . ondansetron (ZOFRAN) tablet 4 mg  4 mg Oral Q6H PRN Alberteen Sam, MD       Or  . ondansetron (ZOFRAN) injection 4 mg  4 mg Intravenous Q6H PRN Cristal Deer  P Danford, MD      . oxyCODONE (Oxy IR/ROXICODONE) immediate release tablet 5 mg  5 mg Oral Q4H PRN Alberteen Samhristopher P Danford, MD   5 mg at 01/24/15 0520  . pantoprazole (PROTONIX) EC tablet 40 mg  40 mg Oral Daily Alberteen Samhristopher P Danford, MD   40 mg at 01/23/15 1029  . risperiDONE (RISPERDAL) tablet 2 mg  2 mg Oral QHS Alberteen Samhristopher P Danford, MD   2 mg at 01/23/15 2131  . sodium chloride 0.9 % injection 10-40 mL  10-40 mL Intracatheter PRN Leroy SeaPrashant K Singh, MD      . traZODone (DESYREL) tablet 50-100 mg  50-100 mg Oral QHS PRN Alberteen Samhristopher P Danford, MD      . vancomycin (VANCOCIN) IVPB 750 mg/150 ml premix  750 mg Intravenous Q8H Earnie LarssonFrank R Wilson, RPH   750 mg at 01/24/15 0430     Discharge Medications: Please see discharge summary for a list of discharge medications.  Relevant Imaging  Results:  Relevant Lab Results:  Recent Labs    Additional Information SS#: [id number]  Rondel Batonngle, Saysha Menta C, LCSW

## 2015-01-24 NOTE — Clinical Social Work Note (Signed)
CSW contacted [facility] to confirm bed offer.  [facility] states [facility] can now only offer patient a private pay bed due to the "type" of Medicaid the patient has.  Patient now has no bed offers and a letter of guarantee bed is being sought after.  Asst CSW Director aware.  Vickii PennaGina Olena Willy, LCSW [phone]  [facility] Licensed Clinical Social Worker

## 2015-01-24 NOTE — Clinical Social Work Note (Addendum)
Clinical Social Work Assessment  Patient Details  Name: Antonio Jensen MRN: [id number] Date of Birth: 08/16/1970  Date of referral:  01/24/15               Reason for consult:                   Permission sought to share information with:  Family Supports, Facility Contact Representative Permission granted to share information::  Yes, Verbal Permission Granted  Name::      (wife)  Agency::   (SNFs in [place])  Relationship::     Contact Information:     Housing/Transportation Living arrangements for the past 2 months:  Single Family Home Source of Information:  Patient Patient Interpreter Needed:  None Criminal Activity/Legal Involvement Pertinent to Current Situation/Hospitalization:  No - Comment as needed Significant Relationships:  Adult Children, Spouse Lives with:    Do you feel safe going back to the place where you live?    Need for family participation in patient care:  Yes (Comment) (patient requests wife involvement)  Care giving concerns:  No caregivers present at time of assessment.   Social Worker assessment / plan:  CSW met with patient to discuss possible SNF placement at time of discharge for wound care.  Patient is agreeable to SNF placement as patient states he cannot manage at home and has limited supports as his wife works full time.  Patient is aware that [facility] was able to make Medicaid bed offer and has accepted.  CSW contacted SNF to confirm bed availability for today- projected discharge.  CSW awaiting a return call.  MD was contacted to complete dc summary and order if discharge today.  Employment status:  Disabled (Comment on whether or not currently receiving Disability) Insurance information:    PT Recommendations:  Skilled Nursing Facility Information / Referral to community resources:  Skilled Nursing Facility  Patient/Family's Response to care:  Patient is agreeable to SNF placement.  Patient/Family's Understanding of and  Emotional Response to Diagnosis, Current Treatment, and Prognosis:  Patient is realistic regarding his treatment/wound care.  Emotional Assessment Appearance:  Appears stated age Attitude/Demeanor/Rapport:   (appropriate) Affect (typically observed):  Accepting, Adaptable Orientation:  Oriented to Place, Oriented to Self, Oriented to  Time, Oriented to Situation Alcohol / Substance use:  Not Applicable Psych involvement (Current and /or in the community):  No (Comment)  Discharge Needs  Concerns to be addressed:  No discharge needs identified Readmission within the last 30 days:  No Current discharge risk:  None Barriers to Discharge:  No Barriers Identified   ,  C, LCSW 01/24/2015, 9:45 AM  

## 2015-01-25 LAB — BASIC METABOLIC PANEL
ANION GAP: 5 (ref 5–15)
BUN: 12 mg/dL (ref 6–20)
CHLORIDE: 103 mmol/L (ref 101–111)
CO2: 28 mmol/L (ref 22–32)
Calcium: 7.9 mg/dL — ABNORMAL LOW (ref 8.9–10.3)
Creatinine, Ser: 0.53 mg/dL — ABNORMAL LOW (ref 0.61–1.24)
Glucose, Bld: 186 mg/dL — ABNORMAL HIGH (ref 65–99)
POTASSIUM: 3.8 mmol/L (ref 3.5–5.1)
SODIUM: 136 mmol/L (ref 135–145)

## 2015-01-25 LAB — VANCOMYCIN, TROUGH: VANCOMYCIN TR: 23 ug/mL — AB (ref 10.0–20.0)

## 2015-01-25 MED ORDER — VANCOMYCIN HCL 500 MG IV SOLR: 500.0000 mg | Freq: Three times a day (TID) | INTRAVENOUS | Status: AC

## 2015-01-25 MED ORDER — HEPARIN SOD (PORK) LOCK FLUSH 100 UNIT/ML IV SOLN
250.0000 [IU] | INTRAVENOUS | Status: AC | PRN
  Administered 2015-01-25: 250 [IU]

## 2015-01-25 MED ORDER — VANCOMYCIN HCL 500 MG IV SOLR
500.0000 mg | Freq: Three times a day (TID) | INTRAVENOUS | Status: DC
  Filled 2015-01-25 (×3): qty 500

## 2015-01-25 NOTE — Progress Notes (Signed)
PT Cancellation Note  Patient Details Name: Flo Shanksimothy J Blassingame MRN: [id number] DOB: 1970-11-27   Cancelled Treatment:    Reason Eval/Treat Not Completed: Pain limiting ability to participate  Jacky KindleKayli Philomina Leon, SPTA [phone] OFFICE  01/25/2015, 4:39 PM

## 2015-01-25 NOTE — Progress Notes (Signed)
ANTIBIOTIC CONSULT NOTE - FOLLOW UP  Pharmacy Consult for vancomycin Indication: wound infection, sacral osteomyelitis  Allergies  Allergen Reactions  . Sulfa Antibiotics Nausea And Vomiting    Patient Measurements: Height: 5\' 2"  (157.5 cm) Weight: 150 lb 8 oz (68.266 kg) IBW/kg (Calculated) : 54.6  Vital Signs: Temp: 98.1 F (36.7 C) (11/30 0514) Temp Source: Oral (11/30 0514) BP: 120/77 mmHg (11/30 0514) Pulse Rate: 87 (11/30 0514) Intake/Output from previous day: 11/29 0701 - 11/30 0700 In: 600 [P.O.:600] Out: 1150 [Urine:1150] Intake/Output from this shift:    Labs:  Recent Labs  01/23/15 0346 01/25/15 0830  WBC 8.6  --   HGB 8.2*  --   PLT 265  --   CREATININE 0.74 0.53*   Estimated Creatinine Clearance: 98.1 mL/min (by C-G formula based on Cr of 0.53).  Recent Labs  01/25/15 0525  VANCOTROUGH 23*     Microbiology: No results found for this or any previous visit (from the past 720 hour(s)).  Anti-infectives    Start     Dose/Rate Route Frequency Ordered Stop   01/25/15 1200  vancomycin (VANCOCIN) 500 mg in sodium chloride 0.9 % 100 mL IVPB     500 mg 100 mL/hr over 60 Minutes Intravenous Every 8 hours 01/25/15 1146     01/24/15 0000  Vancomycin (VANCOCIN) 750 MG/150ML SOLN     750 mg 150 mL/hr over 60 Minutes Intravenous Every 8 hours 01/24/15 1044     01/24/15 0000  cefTRIAXone 2 g in dextrose 5 % 50 mL     2 g 100 mL/hr over 30 Minutes Intravenous Every 24 hours 01/24/15 1044     01/23/15 1200  vancomycin (VANCOCIN) IVPB 750 mg/150 ml premix  Status:  Discontinued     750 mg 150 mL/hr over 60 Minutes Intravenous Every 8 hours 01/23/15 1056 01/25/15 0611   01/23/15 1100  cefTRIAXone (ROCEPHIN) 2 g in dextrose 5 % 50 mL IVPB     2 g 100 mL/hr over 30 Minutes Intravenous Every 24 hours 01/23/15 1044     01/22/15 0100  vancomycin (VANCOCIN) IVPB 750 mg/150 ml premix  Status:  Discontinued     750 mg 150 mL/hr over 60 Minutes Intravenous Every 8  hours 01/22/15 0008 01/22/15 0931   01/22/15 0015  piperacillin-tazobactam (ZOSYN) IVPB 3.375 g  Status:  Discontinued     3.375 g 12.5 mL/hr over 240 Minutes Intravenous 3 times per day 01/22/15 0008 01/22/15 0931      Assessment: 44 y/o M transfer from [facility] with recurrent sacral osteomyelitis/wound infection.  Recently [facility] with [facility], off antibiotics for 2 weeks. Readmitted for possible osteomyelitis. MRI confirms coccygeal osteomyelitis. He was restarted on vancomycin and zosyn yesterday then abx discontinued, to restart vancomycin and ceftriaxone today per ID recommendations.  Plan is for 6 wks of IV vancomycin and ceftriaxone via PICC.  Tmax 99, wbc normal at 8.6, SCr appears normal at 0.53 with good UOP. Vancomycin trough is above goal at 23 on 750 mg q8h.  Goal of Therapy:  Vancomycin trough level 15-20 mcg/ml  Plan:  - Decrease vancomycin to 500 mg IV q8h - Monitor renal function - Check steady-state trough  Gs [facility] CenterJennifer Coburg, [address]Ph[address].D., BCPS Clinical Pharmacist Pager: [phone] 01/25/2015 11:48 AM

## 2015-01-25 NOTE — Consult Note (Signed)
[facility]iat[facility] Consult   Reason for Consult:  Bipolar disorder, depression Referring Physician:  Dr. Candiss Norse Patient Identification: Antonio Jensen MRN:  [id number] Principal Diagnosis: Bipolar depression [facility]) Diagnosis:   Patient Active Problem List   Diagnosis Date Noted  . 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 Osteomyelitis ([place]) [M86.9] 01/21/2015  . Essential hypertension [I10] 01/21/2015  . Bipolar depression ([place]) [F31.30] 01/21/2015    Total Time spent with patient: 20 minutes  Subjective:   Antonio Jensen is a 44 y.o. male patient admitted with sacral osteomyelitis.  HPI:  This patient is a 44 year old married white male who lives with his wife and son in [place]. He is unemployed and applying for disability. He was admitted from an outside hospital with clinical evidence of sacral osteomyelitis and stage IV sacral decubitus ulcer. He is in a wheelchair/bed bound status due to generalized weakness and deconditioning. He was recently treated with the month of antibiotics at [facility]. Currently he is off antibiotics and is not septic and an MRI of the sacral area has been obtained. Apparently the patient has a history of bipolar disorder but he said very little and only answered questions with yes or no and went right back to sleep. I attempted to call his wife at both numbers with no answer. According to the notes he was driving and walking until June of this year and became progressively weak due to lack of activity after he developed a sacral ulcer due to itching. He is a poor historian barely opens his eyes and will not answer most questions other than a yes or no. He is states that he is simply tired of being in and out of hospitals and  His wound not healing. He denies past psychiatric inpatient hospitalizations or psychiatric outpatient treatment. He claims his primary care physician handles all of his medicine. He does not know why he is  on Risperdal. He does not remember being on other medications for depression. He denies being suicidal or having auditory visualizations. He denies use of drugs or alcohol. Past Psychiatric History: Chart reveals history of bipolar disorder but no other information is forthcoming from the patient  Interval history: Patient seen today for psychiatric consultation follow-up. Patient appeared lying down in his bed and continued to complaining about back pain and needed pain medication. Patient denied current symptoms of depression, anxiety, mania and has no evidence of psychosis. Patient has been compliant with his medication management without adverse effects. Patient reportedly has no disturbance of sleep and appetite. Patient does not have new complaints today and reportedly compliant with his medication and had no side effects. Patient denies suicidal/homicidal ideation and has no evidence of psychosis.  Risk to Self: Is patient at risk for suicide?: No Risk to Others:   Prior Inpatient Therapy:   Prior Outpatient Therapy:    Past Medical History:  Past Medical History  Diagnosis Date  . Hypertension   . Bipolar disorder ([place])   . GERD (gastroesophageal reflux disease)   . Diabetes mellitus without complication [facility])     Past Surgical History  Procedure Laterality Date  . Boil i&d  unknown    hx provided by wife   . Testical i&d/boil  unknown    hx provided by wife   . Cardiac cathertization no stent placed  unknown     hx provided by wife    Family History:  Family History  Problem Relation Age of Onset  . Hypertension  Other   . Coronary artery disease Other   . COPD Other    Family Psychiatric  History:none Social History:  History  Alcohol Use No     History  Drug Use No    Social History   Social History  . Marital Status: Married    Spouse Name: N/A  . Number of Children: N/A  . Years of Education: N/A   Social History Main Topics  . Smoking status: Current  Every Day Smoker  . Smokeless tobacco: None  . Alcohol Use: No  . Drug Use: No  . Sexual Activity: Not Asked   Other Topics Concern  . None   Social History Narrative  . None   Additional Social History:                          Allergies:   Allergies  Allergen Reactions  . Sulfa Antibiotics Nausea And Vomiting    Labs:  Results for orders placed or performed during the hospital encounter of 01/21/15 (from the past 48 hour(s))  Vancomycin, trough     Status: Abnormal   Collection Time: 01/25/15  5:25 AM  Result Value Ref Range   Vancomycin Tr 23 (H) 10.0 - 20.0 ug/mL  Basic metabolic panel     Status: Abnormal   Collection Time: 01/25/15  8:30 AM  Result Value Ref Range   Sodium 136 135 - 145 mmol/L   Potassium 3.8 3.5 - 5.1 mmol/L   Chloride 103 101 - 111 mmol/L   CO2 28 22 - 32 mmol/L   Glucose, Bld 186 (H) 65 - 99 mg/dL   BUN 12 6 - 20 mg/dL   Creatinine, Ser 0.53 (L) 0.61 - 1.24 mg/dL   Calcium 7.9 (L) 8.9 - 10.3 mg/dL   GFR calc non Af Amer >60 >60 mL/min   GFR calc Af Amer >60 >60 mL/min    Comment: (NOTE) The eGFR has been calculated using the CKD EPI equation. This calculation has not been validated in all clinical situations. eGFR's persistently <60 mL/min signify possible Chronic Kidney Disease.    Anion gap 5 5 - 15    Current Facility-Administered Medications  Medication Dose Route Frequency Provider Last Rate Last Dose  . acetaminophen (TYLENOL) tablet 650 mg  650 mg Oral Q6H PRN Edwin Dada, MD   650 mg at 01/24/15 1252  . allopurinol (ZYLOPRIM) tablet 300 mg  300 mg Oral Daily Thurnell Lose, MD   300 mg at 01/25/15 0814  . cefTRIAXone (ROCEPHIN) 2 g in dextrose 5 % 50 mL IVPB  2 g Intravenous Q24H Thurnell Lose, MD   2 g at 01/25/15 1132  . DULoxetine (CYMBALTA) DR capsule 30 mg  30 mg Oral Daily Cloria Spring, MD   30 mg at 01/25/15 1638  . enoxaparin (LOVENOX) injection 40 mg  40 mg Subcutaneous Q24H Edwin Dada, MD   40 mg at 01/25/15 0814  . feeding supplement (PRO-STAT SUGAR FREE 64) liquid 30 mL  30 mL Oral BID Reanne J Barbato, RD   30 mL at 01/24/15 1032  . multivitamin with minerals tablet 1 tablet  1 tablet Oral Daily Lorenda Peck, RD   1 tablet at 01/25/15 0814  . ondansetron (ZOFRAN) injection 4 mg  4 mg Intravenous Q6H PRN Edwin Dada, MD      . oxyCODONE (Oxy IR/ROXICODONE) immediate release tablet 5 mg  5 mg Oral Q4H  PRN Edwin Dada, MD   5 mg at 01/25/15 3976  . pantoprazole (PROTONIX) EC tablet 40 mg  40 mg Oral Daily Edwin Dada, MD   40 mg at 01/25/15 0814  . risperiDONE (RISPERDAL) tablet 2 mg  2 mg Oral QHS Edwin Dada, MD   2 mg at 01/24/15 2147  . sodium chloride 0.9 % injection 10-40 mL  10-40 mL Intracatheter PRN Thurnell Lose, MD      . traZODone (DESYREL) tablet 50-100 mg  50-100 mg Oral QHS PRN Edwin Dada, MD      . vancomycin (VANCOCIN) 500 mg in sodium chloride 0.9 % 100 mL IVPB  500 mg Intravenous Q8H Alvira Philips, [facility]        Musculoskeletal: Strength & Muscle Tone: decreased Gait & Station: unable to stand Patient leans: N/A  Psychiatric Specialty Exam: Review of Systems :   Blood pressure 120/77, pulse 87, temperature 98.1 F (36.7 C), temperature source Oral, resp. rate 19, height 5' 2"  (1.575 m), weight 68.266 kg (150 lb 8 oz), SpO2 97 %.Body mass index is 27.52 kg/(m^2).  General Appearance: Disheveled, decreased psychomotor activity   Eye Contact::  Fair  Speech:  Slow  Volume:  Decreased  Mood:  Depressed    Affect:  Congruent and Constricted  Thought Process:  Coherent and Goal Directed  Orientation:  Full (Time, Place, and Person)  Thought Content:  WDL  Suicidal Thoughts:  No  Homicidal Thoughts:  No  Memory:  Immediate;   Fair Recent;   Fair Remote;   Fair  Judgement:  Impaired  Insight:  Lacking  Psychomotor Activity:  Decreased  Concentration:  Fair  Recall:  Poor  Fund of  Knowledge:Poor  Language: Fair  Akathisia:  No  Handed:  Right  AIMS (if indicated):     Assets:  Social Support  ADL's:  Impaired  Cognition: WNL  Sleep:      Treatment Plan Summary: Daily contact with patient to assess and evaluate symptoms and progress in treatment and Medication management  Disposition: Patient does not meet criteria for psychiatric inpatient admission.  Cymbalta 30 mg daily and may increase to 40 mg or 60 mg as clinically required for depression Continue risperidone 2 mg at bedtime for mood swings and trazodone 50 mg for insomnia   Appreciate psychiatric consultation and will sign off at this time May call [phone] if needs further assistance Unit social worker is working on out-of-home placement - skilled nursing facility as he was not able to care for himself.  Antonio Jensen,Antonio R. 01/25/2015 12:59 PM

## 2015-01-25 NOTE — Progress Notes (Signed)
[facility] given to [facility] ([facility]

## 2015-01-25 NOTE — Clinical Social Work Placement (Signed)
   CLINICAL SOCIAL WORK PLACEMENT  NOTE  Date:  01/25/2015  Patient Details  Name: Antonio Jensen MRN: [id number] Date of Birth: [birth year]  Clinical Social Work is seeking post-discharge placement for this patient at the Skilled  Nursing Facility level of care (*CSW will initial, date and re-position this form in  chart as items are completed):  Yes   Patient/family provided with [name] Clinical Social Work Department's list of facilities offering this level of care within the geographic area requested by the patient (or if unable, by the patient's family).  Yes   Patient/family informed of their freedom to choose among providers that offer the needed level of care, that participate in Medicare, Medicaid or managed care program needed by the patient, have an available bed and are willing to accept the patient.  Yes   Patient/family informed of [name]'s ownership interest in [facility] and [facility], as well as of the fact that they are under no obligation to receive care at these facilities.  PASRR submitted to EDS on 01/24/15     PASRR number received on 01/24/15     Existing PASRR number confirmed on       FL2 transmitted to all facilities in geographic area requested by pt/family on 01/24/15     FL2 transmitted to all facilities within larger geographic area on       Patient informed that his/her managed care company has contracts with or will negotiate with certain facilities, including the following:        Yes   Patient/family informed of bed offers received.  [name]     Physician recommends and patient chooses bed at      [name].  Patient to be transferred to facility by [facility]     Patient family notified on 01/25/15 of transfer.  Name of family member notified:  Patient at bedside.     PHYSICIAN       Additional Comment:     _______________________________________________ Rod MaeVaughn, Siddhartha Hoback S, LCSW 01/25/2015, 2:49 PM

## 2015-01-25 NOTE — Progress Notes (Signed)
[facility] per MD order. All questions and concerns answered. Copy of instructions and scripts sent with patient.   [facility]. No distress noted upon discharge.   Dennard NipScott, Antonio Jensen 01/25/2015 4:52 PM

## 2015-01-25 NOTE — Discharge Planning (Signed)
Patient to be discharged to [facility]. Patient updated at bedside.  Facility: [facility] RN report number: [phone] Transportation: EMS  Marcelline Deistmily Kraven [place]alk, [place] [phone] Orthopedics: [phone] Surgical: [phone]

## 2015-01-25 NOTE — Progress Notes (Signed)
Patient Demographics:    Antonio Jensen, is a 44 y.o. male, DOB - Mar 06, 1970, MVH:[id number]  Admit date - 01/21/2015   Admitting Physician [name], MD  Outpatient Primary MD for the patient is No primary care provider on file.  LOS - 4   No chief complaint on file.       Subjective:    Antonio Jensen today has, No headache, No chest pain, No abdominal pain - No Nausea, No new weakness tingling or numbness, No Cough - SOB. Continues to have dull low back pain.   Assessment  & Plan :     1. 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 Recently [facility] with one [facility], off antibiotics for 2 weeks. Readmitted for possible osteomyelitis. MRI confirms coccygeal osteomyelitis. Discussed with IR Dr. Bonnielee HaffHoss who was unable to do biopsy as risk of neurological injury is high.   Discussed his case with ID physician Dr. Drue SecondSnider, we will be placed on 6 weeks of IV vancomycin along with Rocephin via PICC line. Continue wound care outpatient ID follow-up. PICC line has been placed in discharge paperwork done placement per social worker.   2. Generalized weakness and deconditioning. Discussed with wife in detail, patient until June of this year was driving and walking, thereafter became progressively weak due to lack of activity which came about after he developed a sacral ulcer due to itching. Initiated PT and SNF placement.   3. Bipolar disorder with depression. Currently on trazodone, risperidone which will be continued.   4. GERD. On PPI continue.   Code Status : Full  Family Communication  : Wife over the phone  Disposition Plan  :  He has been discharged to SNF on 01/24/2015, disposition per social work  Consults  :  ID physician Dr. Drue SecondSnider over the phone, IR Dr. Bonnielee HaffHoss  Procedures  : MRI sacrum, PICC line requested  DVT Prophylaxis  :  Lovenox   Lab Results  Component Value Date   PLT 265 01/23/2015    Inpatient Medications  Scheduled Meds: . allopurinol  300 mg Oral Daily  . cefTRIAXone (ROCEPHIN)  IV  2 g Intravenous Q24H  . DULoxetine  30 mg Oral Daily  . enoxaparin (LOVENOX) injection  40 mg Subcutaneous Q24H  . feeding supplement (PRO-STAT SUGAR FREE 64)  30 mL Oral BID  . multivitamin with minerals  1 tablet Oral Daily  . pantoprazole  40 mg Oral Daily  . risperiDONE  2 mg Oral QHS   Continuous Infusions:  PRN Meds:.acetaminophen **OR** acetaminophen, ondansetron **OR** ondansetron (ZOFRAN) IV, oxyCODONE, sodium chloride, traZODone  Antibiotics  :     Anti-infectives    Start     Dose/Rate Route Frequency Ordered Stop   01/24/15 0000  Vancomycin (VANCOCIN) 750 MG/150ML SOLN     750 mg 150 mL/hr over 60 Minutes Intravenous Every 8 hours 01/24/15 1044     01/24/15 0000  cefTRIAXone 2 g in dextrose 5 % 50 mL     2 g 100 mL/hr over 30 Minutes Intravenous Every 24 hours 01/24/15 1044     01/23/15 1200  vancomycin (  VANCOCIN) IVPB 750 mg/150 ml premix  Status:  Discontinued     750 mg 150 mL/hr over 60 Minutes Intravenous Every 8 hours 01/23/15 1056 01/25/15 0611   01/23/15 1100  cefTRIAXone (ROCEPHIN) 2 g in dextrose 5 % 50 mL IVPB     2 g 100 mL/hr over 30 Minutes Intravenous Every 24 hours 01/23/15 1044     01/22/15 0100  vancomycin (VANCOCIN) IVPB 750 mg/150 ml premix  Status:  Discontinued     750 mg 150 mL/hr over 60 Minutes Intravenous Every 8 hours 01/22/15 0008 01/22/15 0931   01/22/15 0015  piperacillin-tazobactam (ZOSYN) IVPB 3.375 g  Status:  Discontinued     3.375 g 12.5 mL/hr over 240 Minutes Intravenous 3 times per day 01/22/15 0008 01/22/15 0931        Objective:   Filed  Vitals:   01/24/15 0506 01/24/15 1500 01/24/15 2100 01/25/15 0514  BP: 151/88 151/75 121/75 120/77  Pulse: 97 88 90 87  Temp: 99 F (37.2 C) 99 F (37.2 C) 98.7 F (37.1 C) 98.1 F (36.7 C)  TempSrc: Oral  Oral Oral  Resp: Height:      Weight:      SpO2: 98% 99% 99% 97%    Wt Readings from Last 3 Encounters:  01/21/15 68.266 kg (150 lb 8 oz)     Intake/Output Summary (Last 24 hours) at 01/25/15 1015 Last data filed at 01/25/15 0700  Gross per 24 hour  Intake    480 ml  Output   1150 ml  Net   -670 ml     Physical Exam  Awake Alert, Oriented X 3, No new F.N deficits, Normal affect [place].AT,PERRAL Supple Neck,No JVD, No cervical lymphadenopathy appriciated.  Symmetrical Chest wall movement, Good air movement bilaterally, CTAB RRR,No Gallops,Rubs or new Murmurs, No Parasternal Heave +ve B.Sounds, Abd Soft, No tenderness, No organomegaly appriciated, No rebound - guarding or rigidity. No Cyanosis, Clubbing or edema, No new Rash or bruise  Stage IV sacral decubitus ulcer kindly see wound care note for all details    Data Review:   Micro Results No results found for this or any previous visit (from the past 240 hour(s)).  Radiology Reports Mr Pelvis W Wo Contrast  01/22/2015  CLINICAL DATA:  Sacral decubitus ulcer which developed over the past few months. History of prior debridement of the ulcer. Pain and drainage from the wound. The patient has completed a month of antibiotic therapy over the course of the last 6 weeks for the ulcer. Initial encounter. EXAM: MRI PELVIS WITHOUT AND WITH CONTRAST TECHNIQUE: Multiplanar multisequence MR imaging of the pelvis was performed both before and after administration of intravenous contrast. CONTRAST:  10 mL MULTIHANCE GADOBENATE DIMEGLUMINE 529 MG/ML IV SOLN COMPARISON:  CT abdomen and pelvis 01/21/2015. FINDINGS: This study is markedly degraded by patient motion. As seen on the comparison CT scan, there is a large sacral  decubitus ulcer measuring approximately 3.4 cm transverse by 5.5 cm craniocaudal. The ulcer appears to extend to bone at the coccyx. No abscess is identified. Although difficult to visualize secondary to motion, there appears to be edema and enhancement within the coccyx compatible with osteomyelitis. Bone marrow signal is otherwise unremarkable. Extensive subcutaneous edema is noted. No intrapelvic fluid collection is seen. IMPRESSION: Markedly limited examination demonstrating a large sacral decubitus ulcer which appears to extend to bone at the coccyx with osteomyelitis of the coccyx. No abscess is identified. Diffuse subcutaneous edema compatible with  are anasarca. Electronically Signed   By: Drusilla Kanner M.D.   On: 01/22/2015 14:42     CBC  Recent Labs Lab 01/22/15 0002 01/23/15 0346  WBC 8.1 8.6  HGB 8.7* 8.2*  HCT 27.1* 25.6*  PLT 128* 265  MCV 83.1 84.2  MCH 26.7 27.0  MCHC 32.1 32.0  RDW 14.1 14.1    Chemistries   Recent Labs Lab 01/22/15 0002 01/22/15 0350 01/23/15 0346 01/25/15 0830  NA  --  138 138 136  K  --  3.7 3.6 3.8  CL  --  107 105 103  CO2  --  GLUCOSE  --  157* 282* 186*  BUN  --  25* 19 12  CREATININE 0.67 0.58* 0.74 0.53*  CALCIUM  --  8.1* 7.9* 7.9*  AST  --  10* 11*  --   ALT  --  9* 9*  --   ALKPHOS  --  81 79  --   BILITOT  --  0.2* 0.5  --    ------------------------------------------------------------------------------------------------------------------ estimated creatinine clearance is 98.1 mL/min (by C-G formula based on Cr of 0.53). ------------------------------------------------------------------------------------------------------------------ No results for input(s): HGBA1C in the last 72 hours. ------------------------------------------------------------------------------------------------------------------ No results for input(s): CHOL, HDL, LDLCALC, TRIG, CHOLHDL, LDLDIRECT in the last 72  hours. ------------------------------------------------------------------------------------------------------------------ No results for input(s): TSH, T4TOTAL, T3FREE, THYROIDAB in the last 72 hours.  Invalid input(s): FREET3 ------------------------------------------------------------------------------------------------------------------ No results for input(s): VITAMINB12, FOLATE, FERRITIN, TIBC, IRON, RETICCTPCT in the last 72 hours.  Coagulation profile No results for input(s): INR, PROTIME in the last 168 hours.  No results for input(s): DDIMER in the last 72 hours.  Cardiac Enzymes No results for input(s): CKMB, TROPONINI, MYOGLOBIN in the last 168 hours.  Invalid input(s): CK ------------------------------------------------------------------------------------------------------------------ Invalid input(s): POCBNP   Time Spent in minutes  35   [place] Hanlon K M.D on 01/25/2015 at 10:15 AM  Between 7am to 7pm - Pager - [phone]  After 7pm go to [email or web address] - password [facility]  [facility] -  Office  [phone]

## 2015-03-16 ENCOUNTER — Inpatient Hospital Stay: Payer: Self-pay | Admitting: Internal Medicine

## 2015-03-29 DEATH — deceased

## 2015-04-20 ENCOUNTER — Inpatient Hospital Stay: Payer: Self-pay | Admitting: Internal Medicine

## 2016-06-30 IMAGING — MR MR PELVIS WO/W CM
15 of 23 series · 24 of 48 positions shown · IV contrast (Yes   MH)
Comparison: CT abdomen and pelvis 01/21/2015.

CLINICAL DATA: Sacral decubitus ulcer which developed over the past
few months. History of prior debridement of the ulcer. Pain and
drainage from the wound. The patient has completed a month of
antibiotic therapy over the course of the last 6 weeks for the
ulcer. Initial encounter.

EXAM:
MRI PELVIS WITHOUT AND WITH CONTRAST
TECHNIQUE: Multiplanar multisequence MR imaging of the pelvis was performed
both before and after administration of intravenous contrast.
CONTRAST:  10 mL MULTIHANCE GADOBENATE DIMEGLUMINE 529 MG/ML IV SOLN

[Series 4: T2 · coronal · 5.0mm · 0.66mm/px · 1 of 28 slices shown]
[im 1/28]
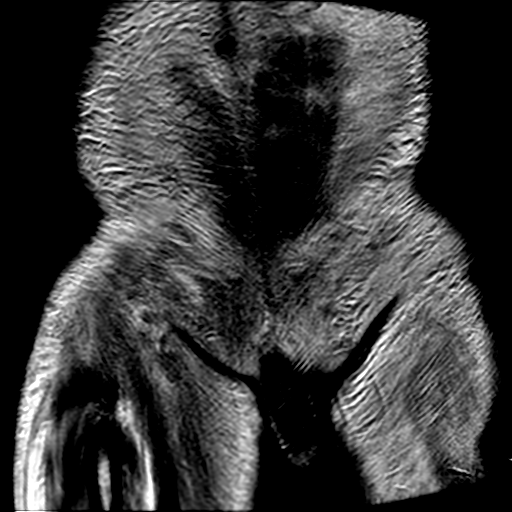

[Series 6: T2 fat-sat · axial · 5.0mm · 1.25mm/px · 1 of 44 slices shown (1 of 2)]
[im 1/44]
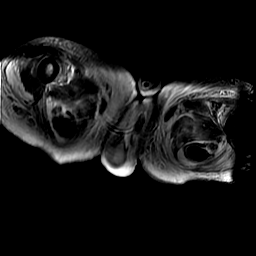

[Series 7: T2 fat-sat · sagittal · 5.0mm · 1.25mm/px · 1 of 36 slices shown (2 of 2)]
[im 1/36]
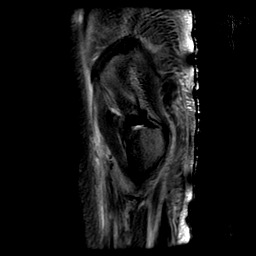

[Series 8: T1 fat-sat · axial · 6.0mm · 1.25mm/px · 1 of 36 slices shown (1 of 2)]
[im 1/36]
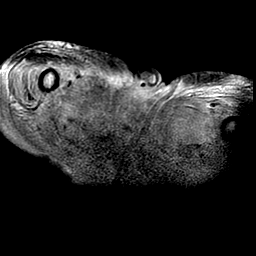

[Series 9: T1 fat-sat · axial · 6.0mm · 1.25mm/px · 1 of 36 slices shown (2 of 2)]
[im 1/36]
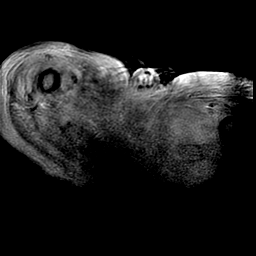

[Series 14: T1 · coronal · 5.0mm · 1.33mm/px · 1 of 14 slices shown]
[im 1/14]
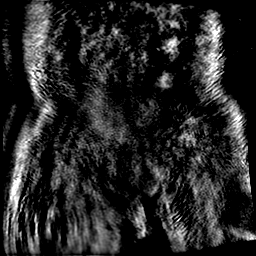

[Series 15: ax ssfse bh · axial · 5.0mm · 0.70mm/px · z∈[-134,+99]mm · 2 of 40 slices shown]
[im 1/40]
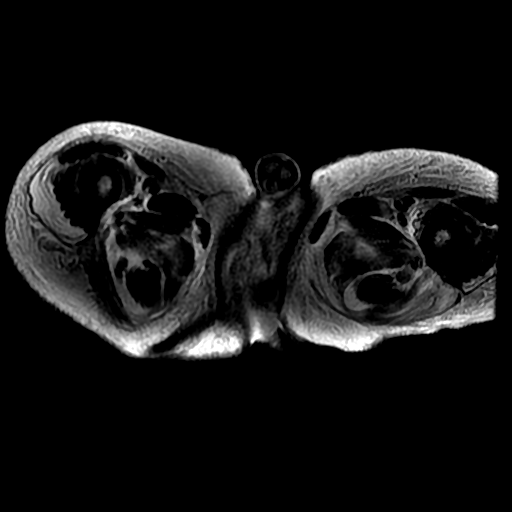
[im 40/40]
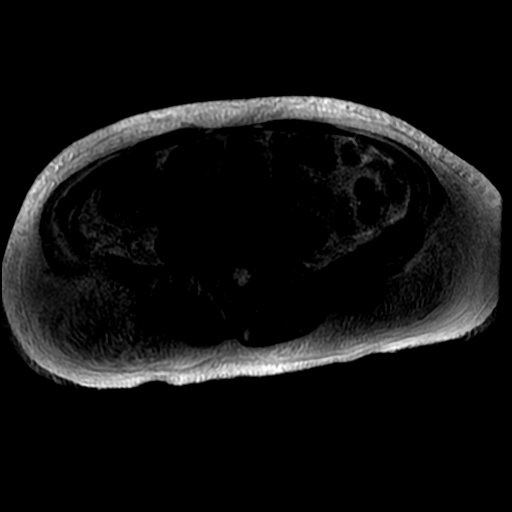

[Series 17: bSSFP · axial · 5.0mm · 0.80mm/px · z∈[-136,+98]mm · 2 of 40 slices shown (1 of 2)]
[im 1/40]
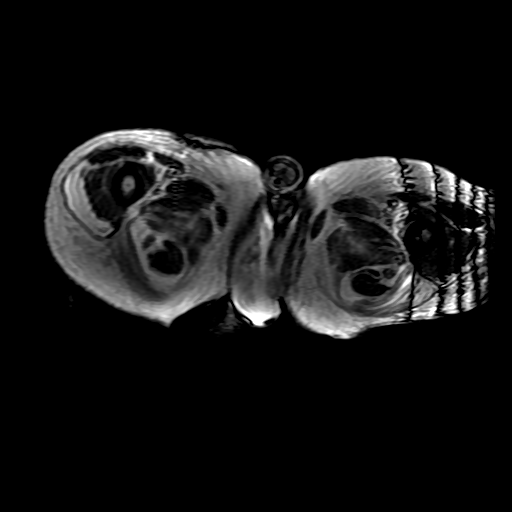
[im 40/40]
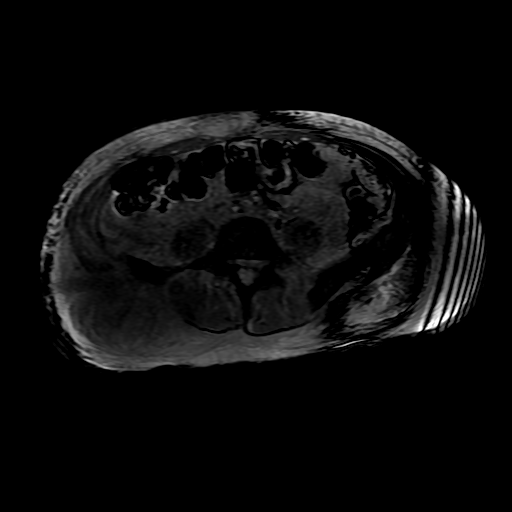

[Series 18: bSSFP · coronal · 5.0mm · 0.80mm/px · 1 of 32 slices shown (2 of 2)]
[im 1/32]
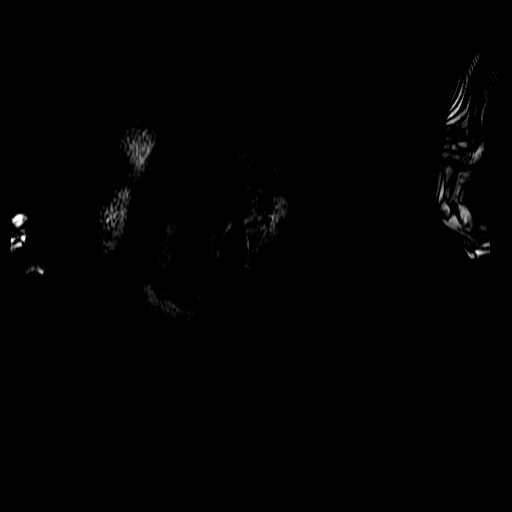

[Series 19: T1 fat-sat post-contrast · coronal · 5.0mm · 1.33mm/px · 1 of 28 slices shown (1 of 2)]
[im 1/28]
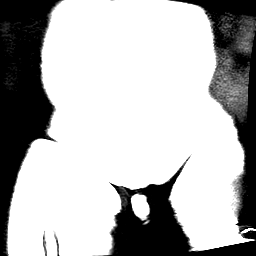

[Series 20: T1 fat-sat post-contrast · axial · 6.0mm · 1.41mm/px · z∈[-134,+99]mm · 2 of 40 slices shown (2 of 2)]
[im 1/40]
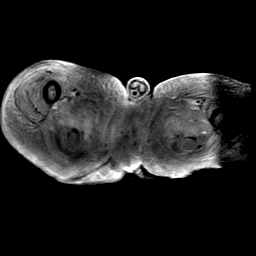
[im 40/40]
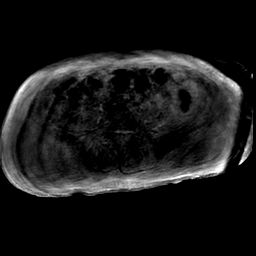

[Series 1100: T1 dynamic · axial · 4.0mm · 0.78mm/px · z∈[-161,+98]mm · 3 of 66 slices shown (1 of 4)]
[im 1/66]
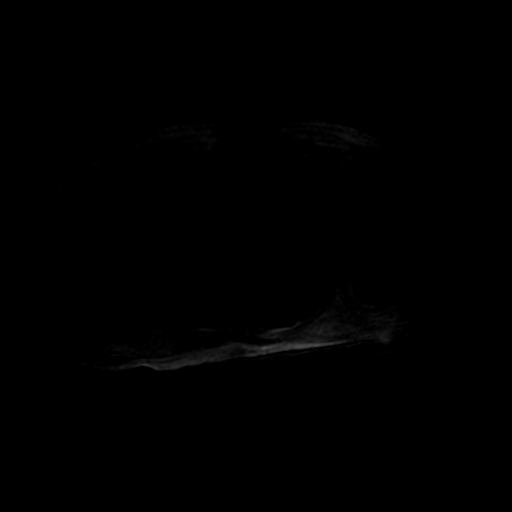
[im 33/66]
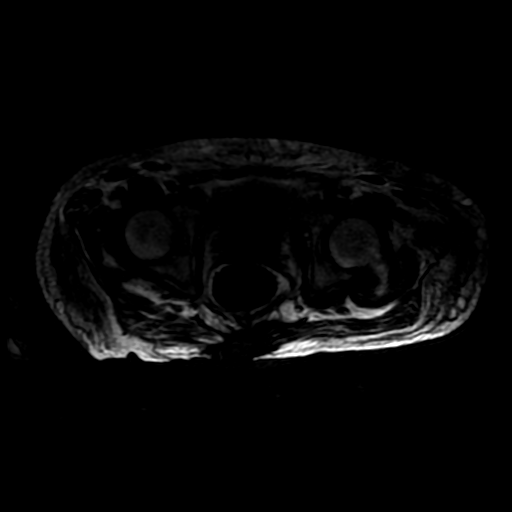
[im 66/66]
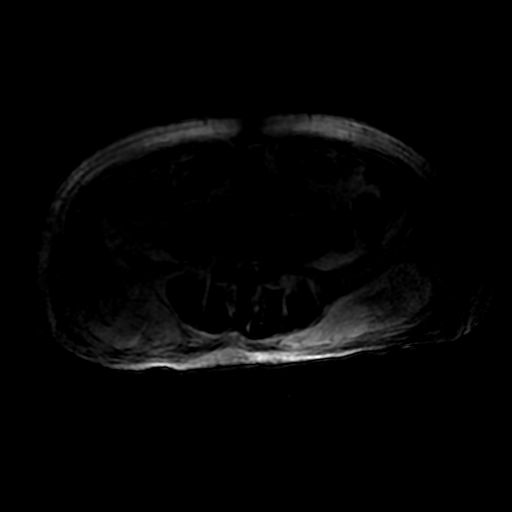

[Series 1101: T1 dynamic · axial · 4.0mm · 0.78mm/px · z∈[-161,+98]mm · 3 of 66 slices shown (2 of 4)]
[im 1/66]
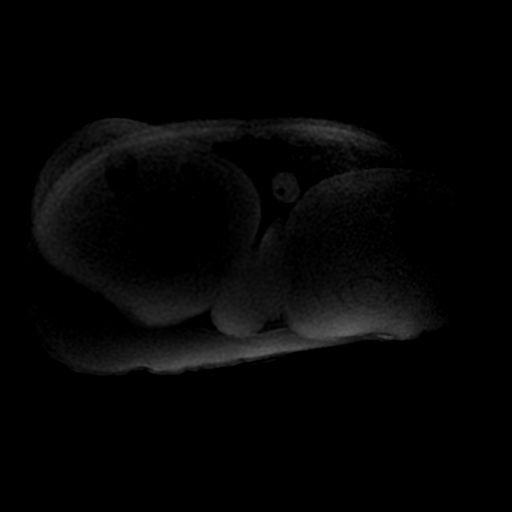
[im 33/66]
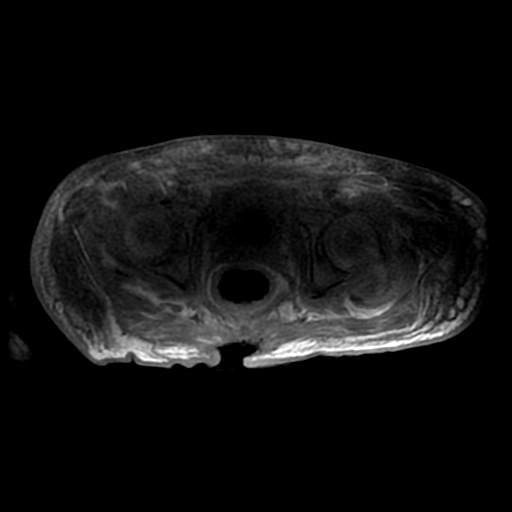
[im 66/66]
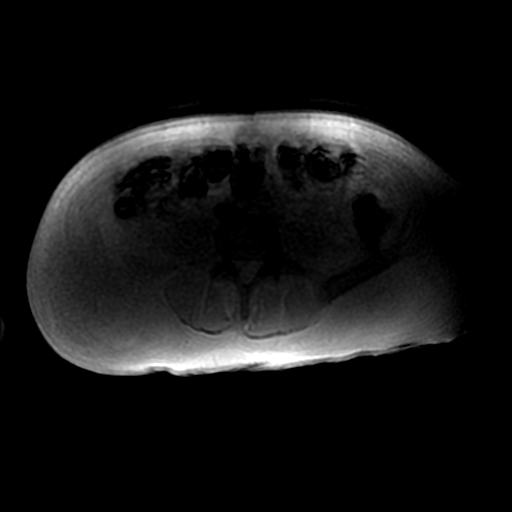

[Series 1102: T1 dynamic · axial · 4.0mm · 0.78mm/px · z∈[-161,+98]mm · 3 of 66 slices shown (3 of 4)]
[im 1/66]
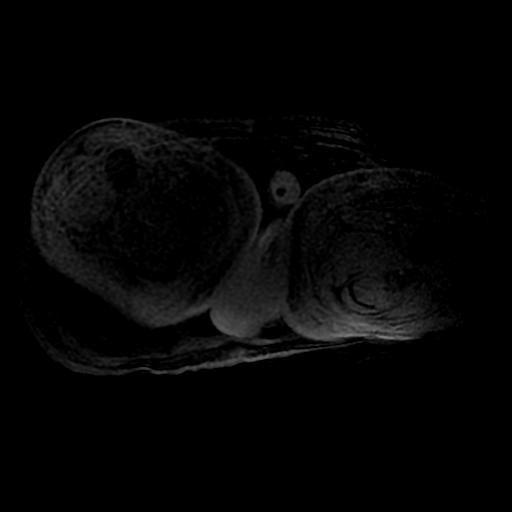
[im 33/66]
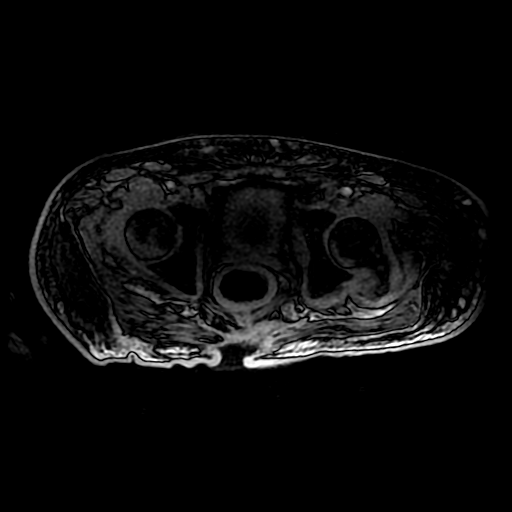
[im 66/66]
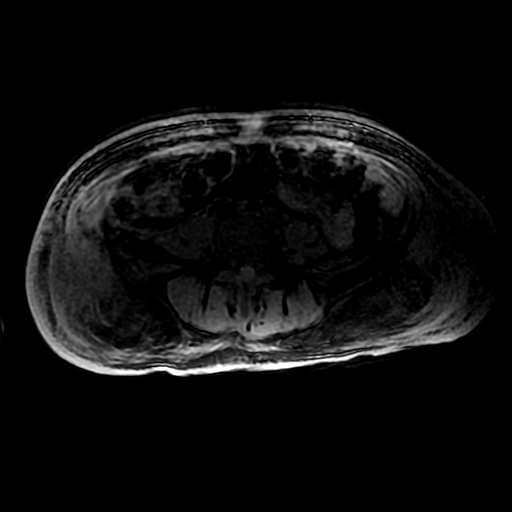

[Series 1103: T1 dynamic · axial · 4.0mm · 0.78mm/px · 1 of 66 slices shown (4 of 4)]
[im 1/66]
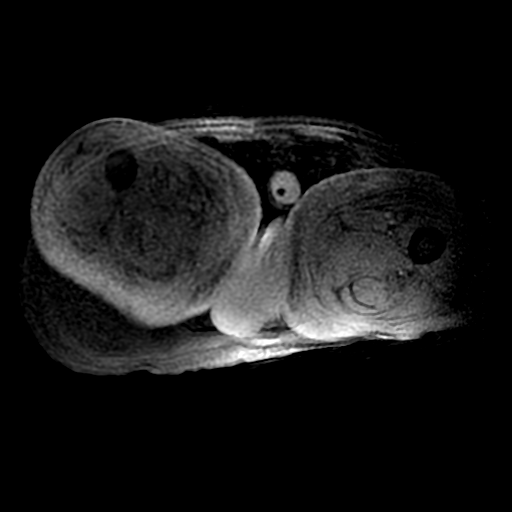

[24 of 48 positions shown; findings below may reference images not displayed]

FINDINGS: This study is markedly degraded by patient motion. As seen on the
comparison CT scan, there is a large sacral decubitus ulcer
measuring approximately 3.4 cm transverse by 5.5 cm craniocaudal.
The ulcer appears to extend to bone at the coccyx. No abscess is
identified. Although difficult to visualize secondary to motion,
there appears to be edema and enhancement within the coccyx
compatible with osteomyelitis. Bone marrow signal is otherwise
unremarkable. Extensive subcutaneous edema is noted. No intrapelvic
fluid collection is seen.
IMPRESSION: Markedly limited examination demonstrating a large sacral decubitus
ulcer which appears to extend to bone at the coccyx with
osteomyelitis of the coccyx. No abscess is identified.

Diffuse subcutaneous edema compatible with are anasarca.
